# Patient Record
Sex: Male | Born: 1955 | Race: White | Hispanic: No | Smoking: Former smoker
Health system: Southern US, Community
[De-identification: ages and names within clinical notes are randomized; demographics above are authoritative.]

## PROBLEM LIST (undated history)

## (undated) DIAGNOSIS — W3400XA Accidental discharge from unspecified firearms or gun, initial encounter: Secondary | ICD-10-CM

## (undated) HISTORY — PX: ABDOMINAL SURGERY: SHX537

## (undated) HISTORY — PX: OTHER SURGICAL HISTORY: SHX169

---

## 1999-08-18 ENCOUNTER — Emergency Department (HOSPITAL_COMMUNITY): Admission: EM | Admit: 1999-08-18 | Discharge: 1999-08-18 | Payer: Self-pay | Admitting: Emergency Medicine

## 1999-08-21 ENCOUNTER — Emergency Department (HOSPITAL_COMMUNITY): Admission: EM | Admit: 1999-08-21 | Discharge: 1999-08-21 | Payer: Self-pay | Admitting: Emergency Medicine

## 2000-12-30 ENCOUNTER — Encounter: Payer: Self-pay | Admitting: Emergency Medicine

## 2000-12-30 ENCOUNTER — Emergency Department (HOSPITAL_COMMUNITY): Admission: EM | Admit: 2000-12-30 | Discharge: 2000-12-30 | Payer: Self-pay | Admitting: *Deleted

## 2001-01-04 ENCOUNTER — Emergency Department (HOSPITAL_COMMUNITY): Admission: EM | Admit: 2001-01-04 | Discharge: 2001-01-04 | Payer: Self-pay | Admitting: Emergency Medicine

## 2001-03-15 ENCOUNTER — Encounter: Payer: Self-pay | Admitting: Emergency Medicine

## 2001-03-16 ENCOUNTER — Encounter: Payer: Self-pay | Admitting: Orthopedic Surgery

## 2001-03-16 ENCOUNTER — Inpatient Hospital Stay (HOSPITAL_COMMUNITY): Admission: AC | Admit: 2001-03-16 | Discharge: 2001-03-18 | Payer: Self-pay

## 2003-07-10 ENCOUNTER — Inpatient Hospital Stay (HOSPITAL_COMMUNITY): Admission: EM | Admit: 2003-07-10 | Discharge: 2003-07-11 | Payer: Self-pay | Admitting: *Deleted

## 2004-10-11 ENCOUNTER — Emergency Department (HOSPITAL_COMMUNITY): Admission: EM | Admit: 2004-10-11 | Discharge: 2004-10-11 | Payer: Self-pay | Admitting: Emergency Medicine

## 2004-11-17 ENCOUNTER — Inpatient Hospital Stay (HOSPITAL_COMMUNITY): Admission: EM | Admit: 2004-11-17 | Discharge: 2004-11-21 | Payer: Self-pay | Admitting: Emergency Medicine

## 2008-12-22 ENCOUNTER — Ambulatory Visit: Payer: Self-pay | Admitting: Internal Medicine

## 2009-04-15 ENCOUNTER — Inpatient Hospital Stay (HOSPITAL_COMMUNITY): Admission: EM | Admit: 2009-04-15 | Discharge: 2009-04-17 | Payer: Self-pay | Admitting: Emergency Medicine

## 2010-09-20 LAB — URINE MICROSCOPIC-ADD ON

## 2010-09-20 LAB — DIFFERENTIAL
Eosinophils Relative: 1 % (ref 0–5)
Lymphocytes Relative: 17 % (ref 12–46)
Lymphs Abs: 1.5 10*3/uL (ref 0.7–4.0)
Monocytes Absolute: 0.4 10*3/uL (ref 0.1–1.0)
Monocytes Relative: 4 % (ref 3–12)
Neutro Abs: 6.9 10*3/uL (ref 1.7–7.7)

## 2010-09-20 LAB — CBC
HCT: 36.8 % — ABNORMAL LOW (ref 39.0–52.0)
HCT: 40.8 % (ref 39.0–52.0)
HCT: 43.7 % (ref 39.0–52.0)
Hemoglobin: 14.1 g/dL (ref 13.0–17.0)
MCHC: 34.1 g/dL (ref 30.0–36.0)
MCV: 89.7 fL (ref 78.0–100.0)
MCV: 90.2 fL (ref 78.0–100.0)
MCV: 91 fL (ref 78.0–100.0)
Platelets: 108 10*3/uL — ABNORMAL LOW (ref 150–400)
Platelets: 129 10*3/uL — ABNORMAL LOW (ref 150–400)
RBC: 4.05 MIL/uL — ABNORMAL LOW (ref 4.22–5.81)
RDW: 12.5 % (ref 11.5–15.5)
WBC: 10 10*3/uL (ref 4.0–10.5)
WBC: 6 10*3/uL (ref 4.0–10.5)

## 2010-09-20 LAB — COMPREHENSIVE METABOLIC PANEL
ALT: 20 U/L (ref 0–53)
Albumin: 4.1 g/dL (ref 3.5–5.2)
Alkaline Phosphatase: 59 U/L (ref 39–117)
BUN: 11 mg/dL (ref 6–23)
Calcium: 8.9 mg/dL (ref 8.4–10.5)
Chloride: 103 mEq/L (ref 96–112)
Creatinine, Ser: 1.07 mg/dL (ref 0.4–1.5)
GFR calc Af Amer: >60 mL/min (ref >60)
Total Bilirubin: 0.6 mg/dL (ref 0.3–1.2)
Total Protein: 6.5 g/dL (ref 6.0–8.3)

## 2010-09-20 LAB — POCT I-STAT, CHEM 8
BUN: 14 mg/dL (ref 6–23)
Calcium, Ion: 1.15 mmol/L (ref 1.12–1.32)
Creatinine, Ser: 1.3 mg/dL (ref 0.4–1.5)
Glucose, Bld: 118 mg/dL — ABNORMAL HIGH (ref 70–99)
HCT: 45 % (ref 39.0–52.0)
Hemoglobin: 15.3 g/dL (ref 13.0–17.0)
TCO2: 33 mmol/L (ref 0–100)

## 2010-09-20 LAB — URINALYSIS, ROUTINE W REFLEX MICROSCOPIC
Hgb urine dipstick: NEGATIVE
Leukocytes, UA: NEGATIVE
Nitrite: NEGATIVE
Protein, ur: 30 mg/dL — AB
Specific Gravity, Urine: 1.019 (ref 1.005–1.030)
Urobilinogen, UA: 0.2 mg/dL (ref 0.0–1.0)

## 2010-09-20 LAB — BASIC METABOLIC PANEL
CO2: 30 mEq/L (ref 19–32)
Calcium: 8.8 mg/dL (ref 8.4–10.5)
Chloride: 109 mEq/L (ref 96–112)
Creatinine, Ser: 1.15 mg/dL (ref 0.4–1.5)
GFR calc Af Amer: >60 mL/min (ref >60)
GFR calc non Af Amer: >60 mL/min (ref >60)
Potassium: 4.1 mEq/L (ref 3.5–5.1)
Potassium: 4.3 mEq/L (ref 3.5–5.1)
Sodium: 139 mEq/L (ref 135–145)
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
## 2010-11-02 NOTE — Discharge Summary (Signed)
NAMEMAMOUDOU, MULVEHILL                ACCOUNT NO.:  [id number]   MEDICAL RECORD NO.:  [id number]          PATIENT TYPE:  INP   LOCATION:  5019                         FACILITY:  [facility]   PHYSICIAN:  Leonie Man, M.D.   DATE OF BIRTH:  1955/08/22   DATE OF ADMISSION:  11/16/2004  DATE OF DISCHARGE:  11/21/2004                                 DISCHARGE SUMMARY   DISCHARGE DIAGNOSES:  1.  Small bowel obstruction, resolved.  2.  History of cholecystectomy, exploratory.   Mr. Vanbenschoten is a  55 year old male patient who developed abdominal pain  several days prior to admission. He states it comes in waves. A CT scan in  the emergency room on November 17, 2004, showed a partial small bowel obstruction  with a transition zone in the right upper quadrant. He does have some  vomiting, but no flatus or bowel movement. He was in the hospital over the  next several days and gradually his bowel function returned to normal,  initial less than 6, his small bowel obstruction had resolved.   He will be discharged to home in stable condition. He has no specific follow-  up. He is to call if he has further problems.      Guy Franco, P.A.      Leonie Man, M.D.  Electronically Signed    LB/MEDQ  D:  02/25/2005  T:  02/25/2005  Job:  [id number]

## 2010-11-02 NOTE — Discharge Summary (Signed)
[name], BRIDGE                ACCOUNT NO.:  [id number]   MEDICAL RECORD NO.:  [id number]          PATIENT TYPE:  INP   LOCATION:  5019                         FACILITY:  [facility]   PHYSICIAN:  Leonie Man, M.D.   DATE OF BIRTH:  1956/01/23   DATE OF ADMISSION:  11/16/2004  DATE OF DISCHARGE:  11/21/2004                                 DISCHARGE SUMMARY   ADMISSION DIAGNOSIS:  Small bowel obstruction.   DISCHARGE DIAGNOSIS:  Small bowel obstruction.   PROCEDURES IN HOSPITAL:  None.   DISCHARGE DIET:  Unrestricted.   MEDICATIONS:  None.   NOTE:  Brian Baldwin is a 55 year old man admitted after 1-1/2 days of after  having developed abdominal pain.  He has had no passage of flatus.  The pain  waxes and wanes.  CT scan today showed partial small bowel obstruction with  transition zone in the right upper quadrant.  The patient was admitted and  placed on nasogastric decompression and observed.  Over the ensuing days,  the patient began to feel better with continued improvement in his KUB's and  bowel obstructive status.  He was started on clear liquids, which he  tolerated without difficulty.  He is being discharged now to follow up  p.r.n. should this recur.  There were no medications given at the time of  discharge.      Leonie Man, M.D.  Electronically Signed     PB/MEDQ  D:  02/19/2005  T:  02/19/2005  Job:  [id number]
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
## 2013-03-06 ENCOUNTER — Emergency Department (HOSPITAL_COMMUNITY)
Admission: EM | Admit: 2013-03-06 | Discharge: 2013-03-07 | Disposition: A | Discharge status: Home / Self Care | Payer: Medicaid Other | Attending: Emergency Medicine | Admitting: Emergency Medicine

## 2013-03-06 DIAGNOSIS — R141 Gas pain: Secondary | ICD-10-CM | POA: Insufficient documentation

## 2013-03-06 DIAGNOSIS — Z79899 Other long term (current) drug therapy: Secondary | ICD-10-CM | POA: Insufficient documentation

## 2013-03-06 DIAGNOSIS — R110 Nausea: Secondary | ICD-10-CM | POA: Insufficient documentation

## 2013-03-06 DIAGNOSIS — R142 Eructation: Secondary | ICD-10-CM | POA: Insufficient documentation

## 2013-03-06 DIAGNOSIS — Z87828 Personal history of other (healed) physical injury and trauma: Secondary | ICD-10-CM | POA: Insufficient documentation

## 2013-03-06 DIAGNOSIS — Z87891 Personal history of nicotine dependence: Secondary | ICD-10-CM | POA: Insufficient documentation

## 2013-03-06 DIAGNOSIS — R109 Unspecified abdominal pain: Secondary | ICD-10-CM

## 2013-03-06 DIAGNOSIS — R1084 Generalized abdominal pain: Secondary | ICD-10-CM | POA: Insufficient documentation

## 2013-03-06 HISTORY — DX: Accidental discharge from unspecified firearms or gun, initial encounter: W34.00XA

## 2013-03-06 MED ORDER — SODIUM CHLORIDE 0.9 % IV SOLN
INTRAVENOUS | Status: DC
  Administered 2013-03-07: 02:00:00 via INTRAVENOUS

## 2013-03-06 NOTE — ED Notes (Signed)
Bed: WA19 Expected date:  Expected time:  Means of arrival:  Comments: EMS-abdominal pain 

## 2013-03-07 ENCOUNTER — Emergency Department (HOSPITAL_COMMUNITY): Payer: Medicaid Other

## 2013-03-07 ENCOUNTER — Encounter (HOSPITAL_COMMUNITY): Payer: Self-pay | Admitting: Emergency Medicine

## 2013-03-07 LAB — COMPREHENSIVE METABOLIC PANEL
ALT: 20 U/L (ref 0–53)
Alkaline Phosphatase: 64 U/L (ref 39–117)
CO2: 26 mEq/L (ref 19–32)
Calcium: 9.7 mg/dL (ref 8.4–10.5)
Chloride: 98 mEq/L (ref 96–112)
GFR calc Af Amer: 83 mL/min — ABNORMAL LOW (ref >90)
GFR calc non Af Amer: 71 mL/min — ABNORMAL LOW (ref >90)
Glucose, Bld: 125 mg/dL — ABNORMAL HIGH (ref 70–99)
Sodium: 135 mEq/L (ref 135–145)
Total Bilirubin: 0.4 mg/dL (ref 0.3–1.2)

## 2013-03-07 LAB — URINALYSIS, ROUTINE W REFLEX MICROSCOPIC
Bilirubin Urine: NEGATIVE
Glucose, UA: NEGATIVE mg/dL
Hgb urine dipstick: NEGATIVE
Ketones, ur: NEGATIVE mg/dL
Protein, ur: NEGATIVE mg/dL
Urobilinogen, UA: 1 mg/dL (ref 0.0–1.0)
pH: 7.5 (ref 5.0–8.0)

## 2013-03-07 LAB — CBC
HCT: 44.2 % (ref 39.0–52.0)
Hemoglobin: 15.3 g/dL (ref 13.0–17.0)
MCH: 30.3 pg (ref 26.0–34.0)
Platelets: 134 10*3/uL — ABNORMAL LOW (ref 150–400)
RBC: 5.05 MIL/uL (ref 4.22–5.81)

## 2013-03-07 MED ORDER — IOHEXOL 300 MG/ML  SOLN
100.0000 mL | Freq: Once | INTRAMUSCULAR | Status: AC | PRN
  Administered 2013-03-07: 100 mL via INTRAVENOUS

## 2013-03-07 MED ORDER — FENTANYL CITRATE 0.05 MG/ML IJ SOLN
50.0000 ug | Freq: Once | INTRAMUSCULAR | Status: AC
  Administered 2013-03-07: 50 ug via INTRAVENOUS
  Filled 2013-03-07: qty 2

## 2013-03-07 MED ORDER — HYDROMORPHONE HCL PF 1 MG/ML IJ SOLN
1.0000 mg | INTRAMUSCULAR | Status: DC | PRN
  Administered 2013-03-07 (×2): 1 mg via INTRAVENOUS
  Filled 2013-03-07 (×2): qty 1

## 2013-03-07 MED ORDER — HYDROCODONE-ACETAMINOPHEN 5-325 MG PO TABS: 1.0000 | ORAL_TABLET | Freq: Four times a day (QID) | ORAL | Status: DC | PRN

## 2013-03-07 MED ORDER — PROMETHAZINE HCL 25 MG PO TABS: 25.0000 mg | ORAL_TABLET | Freq: Four times a day (QID) | ORAL | Status: DC | PRN

## 2013-03-07 MED ORDER — ONDANSETRON HCL 4 MG/2ML IJ SOLN
4.0000 mg | Freq: Once | INTRAMUSCULAR | Status: AC
  Administered 2013-03-07: 4 mg via INTRAVENOUS
  Filled 2013-03-07: qty 2

## 2013-03-07 MED ORDER — IOHEXOL 300 MG/ML  SOLN
50.0000 mL | Freq: Once | INTRAMUSCULAR | Status: AC | PRN
  Administered 2013-03-07: 50 mL via ORAL

## 2013-03-07 NOTE — ED Provider Notes (Signed)
Medical screening examination/treatment/procedure(s) were performed by non-physician practitioner and as supervising physician I was immediately available for consultation/collaboration.  Sunnie Nielsen, MD 03/07/13 [phone]
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
## 2013-03-07 NOTE — ED Notes (Signed)
Pt returned from xray

## 2013-03-07 NOTE — ED Notes (Signed)
Patient transported to X-ray 

## 2013-03-07 NOTE — ED Notes (Signed)
Patient given urinal for urine speciman.

## 2018-06-16 DIAGNOSIS — J069 Acute upper respiratory infection, unspecified: Secondary | ICD-10-CM | POA: Insufficient documentation

## 2018-06-16 DIAGNOSIS — B9789 Other viral agents as the cause of diseases classified elsewhere: Secondary | ICD-10-CM | POA: Insufficient documentation

## 2018-06-16 DIAGNOSIS — Z87891 Personal history of nicotine dependence: Secondary | ICD-10-CM | POA: Insufficient documentation

## 2018-06-16 DIAGNOSIS — J358 Other chronic diseases of tonsils and adenoids: Secondary | ICD-10-CM | POA: Insufficient documentation

## 2018-06-16 DIAGNOSIS — Z79899 Other long term (current) drug therapy: Secondary | ICD-10-CM | POA: Insufficient documentation

## 2018-06-17 ENCOUNTER — Emergency Department (HOSPITAL_COMMUNITY)
Admission: EM | Admit: 2018-06-17 | Discharge: 2018-06-17 | Disposition: A | Discharge status: Home / Self Care | Payer: Self-pay | Attending: Emergency Medicine | Admitting: Emergency Medicine

## 2018-06-17 DIAGNOSIS — B9789 Other viral agents as the cause of diseases classified elsewhere: Secondary | ICD-10-CM

## 2018-06-17 DIAGNOSIS — J358 Other chronic diseases of tonsils and adenoids: Secondary | ICD-10-CM

## 2018-06-17 DIAGNOSIS — J069 Acute upper respiratory infection, unspecified: Secondary | ICD-10-CM

## 2018-06-17 LAB — GROUP A STREP BY PCR: Group A Strep by PCR: NOT DETECTED

## 2018-06-17 NOTE — ED Notes (Signed)
Patient is A&Ox4.  No signs of distress noted.  Please see providers complete history and physical exam.  

## 2018-06-17 NOTE — Discharge Instructions (Addendum)
You may alternate Tylenol 1000 mg every 6 hours as needed for pain and Ibuprofen 800 mg every 8 hours as needed for pain.  Please take Ibuprofen with food.  You may gargle with warm salt water several times a day as needed for sore throat.  You may use over-the-counter Chloraseptic spray, throat lozenges as needed.  Your strep test today was negative.

## 2018-06-17 NOTE — ED Triage Notes (Signed)
Patient states he has had a productive cough, x1 week, with complaints of soreness. Pt states that he looked in the mirror tonight and norticed a white spot on the back of his throat.
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
## 2019-01-11 ENCOUNTER — Encounter (HOSPITAL_COMMUNITY): Payer: Self-pay

## 2019-01-11 ENCOUNTER — Other Ambulatory Visit: Payer: Self-pay

## 2019-01-11 ENCOUNTER — Emergency Department (HOSPITAL_COMMUNITY)
Admission: EM | Admit: 2019-01-11 | Discharge: 2019-01-11 | Disposition: A | Discharge status: Home / Self Care | Payer: Self-pay | Attending: Emergency Medicine | Admitting: Emergency Medicine

## 2019-01-11 ENCOUNTER — Emergency Department (HOSPITAL_COMMUNITY): Payer: Self-pay

## 2019-01-11 DIAGNOSIS — R51 Headache: Secondary | ICD-10-CM | POA: Insufficient documentation

## 2019-01-11 DIAGNOSIS — Z79899 Other long term (current) drug therapy: Secondary | ICD-10-CM | POA: Insufficient documentation

## 2019-01-11 DIAGNOSIS — Z20828 Contact with and (suspected) exposure to other viral communicable diseases: Secondary | ICD-10-CM | POA: Insufficient documentation

## 2019-01-11 DIAGNOSIS — Z87891 Personal history of nicotine dependence: Secondary | ICD-10-CM | POA: Insufficient documentation

## 2019-01-11 DIAGNOSIS — R519 Headache, unspecified: Secondary | ICD-10-CM

## 2019-01-11 DIAGNOSIS — R0602 Shortness of breath: Secondary | ICD-10-CM | POA: Insufficient documentation

## 2019-01-11 LAB — BASIC METABOLIC PANEL
Anion gap: 10 (ref 5–15)
BUN: 13 mg/dL (ref 8–23)
CO2: 25 mmol/L (ref 22–32)
Calcium: 9.3 mg/dL (ref 8.9–10.3)
Chloride: 104 mmol/L (ref 98–111)
Creatinine, Ser: 1.12 mg/dL (ref 0.61–1.24)
GFR calc Af Amer: >60 mL/min (ref >60)
GFR calc non Af Amer: >60 mL/min (ref >60)
Glucose, Bld: 95 mg/dL (ref 70–99)
Potassium: 4.1 mmol/L (ref 3.5–5.1)
Sodium: 139 mmol/L (ref 135–145)

## 2019-01-11 LAB — CBC
HCT: 46.8 % (ref 39.0–52.0)
Hemoglobin: 15.4 g/dL (ref 13.0–17.0)
MCH: 30.2 pg (ref 26.0–34.0)
MCHC: 32.9 g/dL (ref 30.0–36.0)
MCV: 91.8 fL (ref 80.0–100.0)
Platelets: 146 10*3/uL — ABNORMAL LOW (ref 150–400)
RBC: 5.1 MIL/uL (ref 4.22–5.81)
RDW: 12.3 % (ref 11.5–15.5)
WBC: 6.6 10*3/uL (ref 4.0–10.5)
nRBC: 0 % (ref 0.0–0.2)

## 2019-01-11 LAB — TROPONIN I (HIGH SENSITIVITY): Troponin I (High Sensitivity): 3 ng/L (ref <18)

## 2019-01-11 MED ORDER — SODIUM CHLORIDE 0.9% FLUSH
3.0000 mL | Freq: Once | INTRAVENOUS | Status: AC
  Administered 2019-01-11: 3 mL via INTRAVENOUS
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
## 2019-01-11 NOTE — ED Triage Notes (Signed)
Patient c/o a headache x 1 week. Patient also c/o chest tightness since yesterday. patient states he is constantly SOB.

## 2019-01-11 NOTE — Discharge Instructions (Signed)
Follow-up with your doctor for further management of the symptoms.

## 2019-01-12 LAB — NOVEL CORONAVIRUS, NAA (HOSP ORDER, SEND-OUT TO REF LAB; TAT 18-24 HRS): SARS-CoV-2, NAA: NOT DETECTED

## 2020-01-06 ENCOUNTER — Other Ambulatory Visit: Payer: Self-pay

## 2020-01-06 ENCOUNTER — Telehealth (INDEPENDENT_AMBULATORY_CARE_PROVIDER_SITE_OTHER): Payer: No Payment, Other | Admitting: Psychiatric/Mental Health

## 2020-01-06 DIAGNOSIS — F3341 Major depressive disorder, recurrent, in partial remission: Secondary | ICD-10-CM | POA: Diagnosis not present

## 2020-01-06 DIAGNOSIS — F319 Bipolar disorder, unspecified: Secondary | ICD-10-CM | POA: Insufficient documentation

## 2020-01-06 DIAGNOSIS — F329 Major depressive disorder, single episode, unspecified: Secondary | ICD-10-CM | POA: Insufficient documentation

## 2020-01-06 MED ORDER — OXCARBAZEPINE 150 MG PO TABS: 300.0000 mg | ORAL_TABLET | Freq: Every day | ORAL | 3 refills | Status: DC

## 2020-01-06 MED ORDER — SERTRALINE HCL 50 MG PO TABS: 50.0000 mg | ORAL_TABLET | Freq: Every day | ORAL | 3 refills | Status: DC
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
## 2020-02-05 ENCOUNTER — Encounter (HOSPITAL_COMMUNITY): Payer: Self-pay | Admitting: Psychiatric/Mental Health

## 2021-02-24 IMAGING — CT CT HEAD WITHOUT CONTRAST
3 series · 15 of 47 positions shown, 18 images · non-contrast
Comparison: None.

CLINICAL DATA: Headache for 1 week

EXAM:
CT HEAD WITHOUT CONTRAST
TECHNIQUE: Contiguous axial images were obtained from the base of the skull
through the vertex without intravenous contrast.

[Series 2: head wo · axial · 0.47mm/px · z∈[+1646,+1776]mm · 9 of 32 slices shown, 12 images]
[im 3/32  brain]
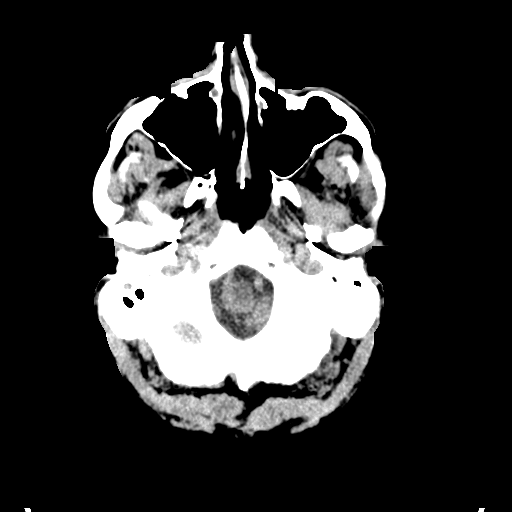
[im 3/32  bone]
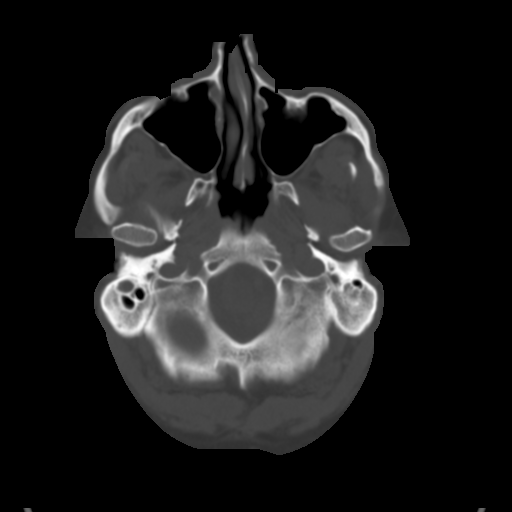
[im 6/32  brain]
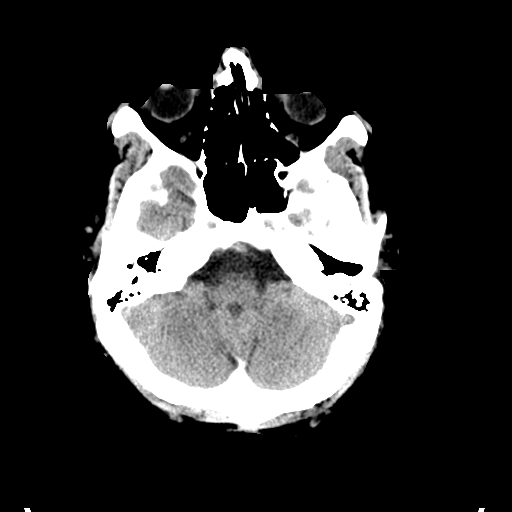
[im 9/32  brain]
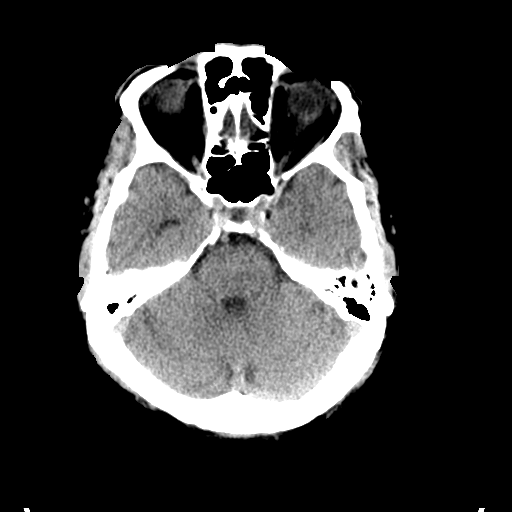
[im 12/32  brain]
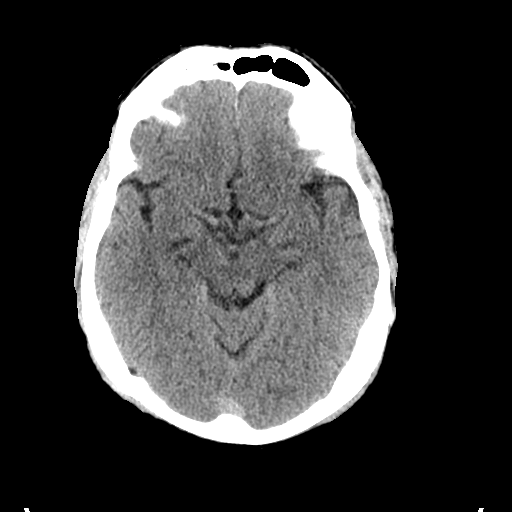
[im 17/32  brain]
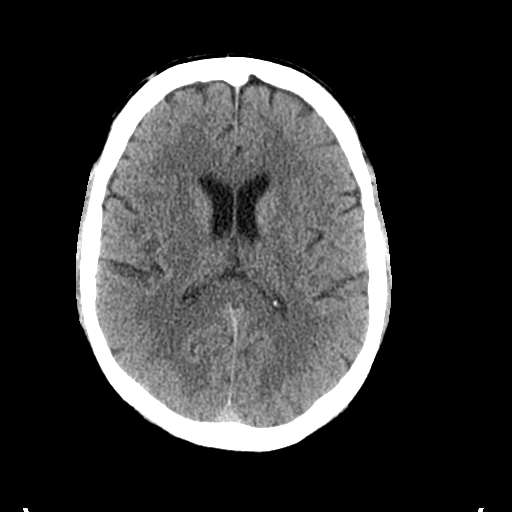
[im 17/32  bone]
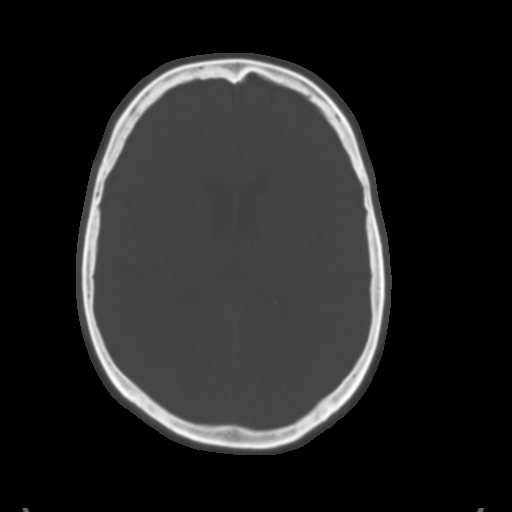
[im 20/32  brain]
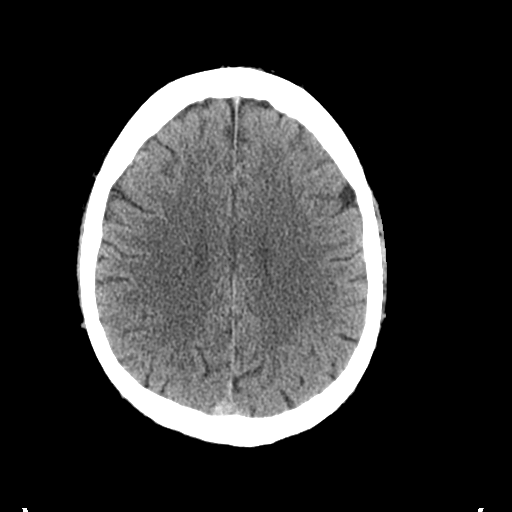
[im 23/32  brain]
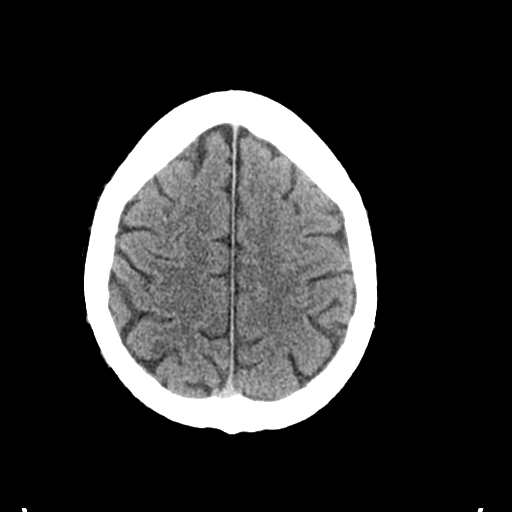
[im 26/32  brain]
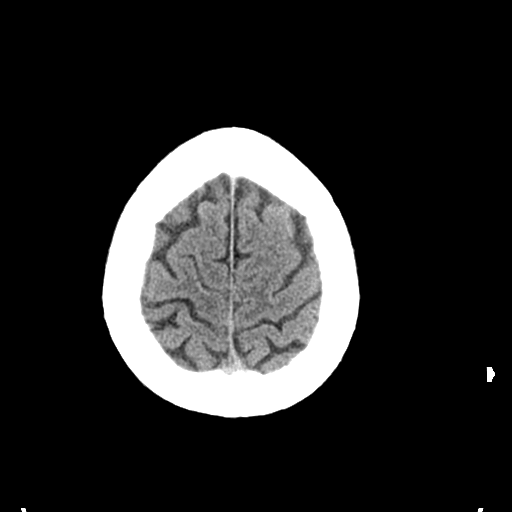
[im 29/32  brain]
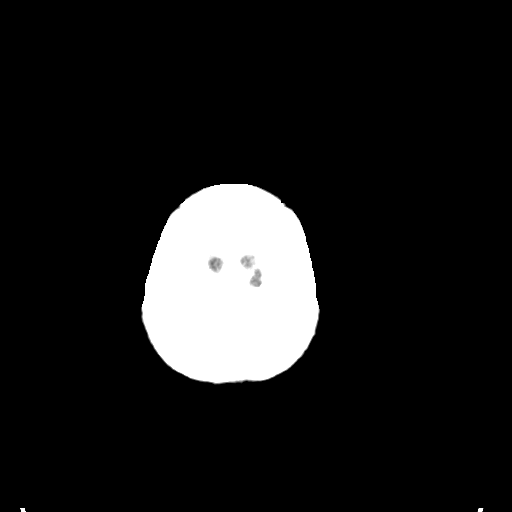
[im 29/32  bone]
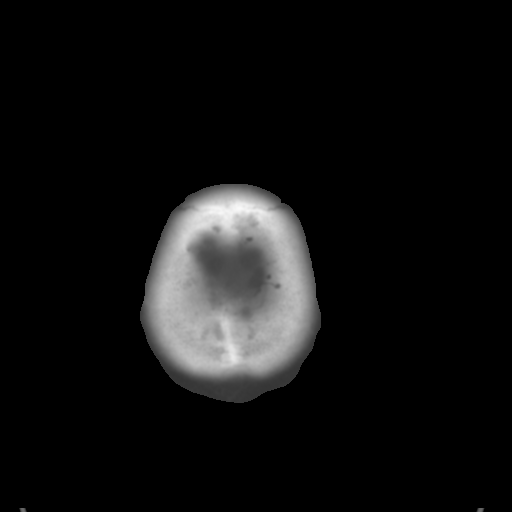

[Series 4: coronal soft tissue · coronal · 0.39mm/px · 3 of 67 slices shown]
[im 23/67  brain]
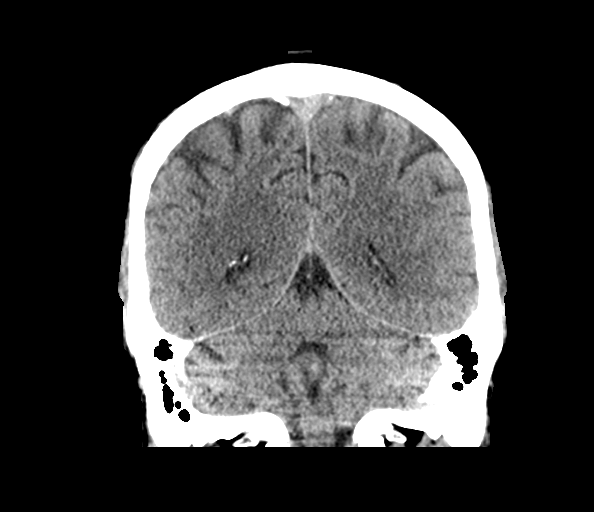
[im 30/67  brain]
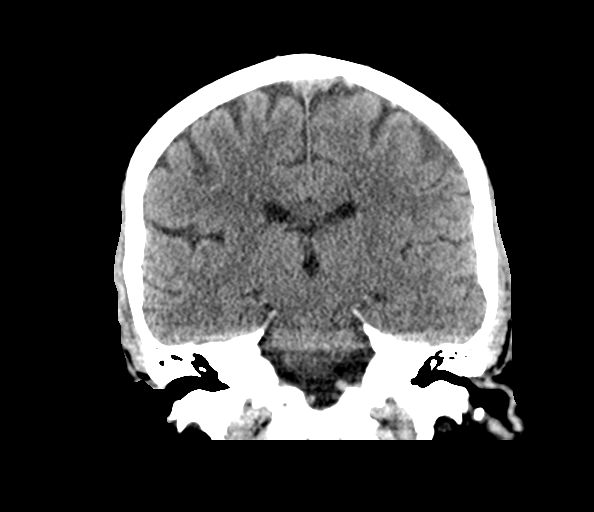
[im 37/67  brain]
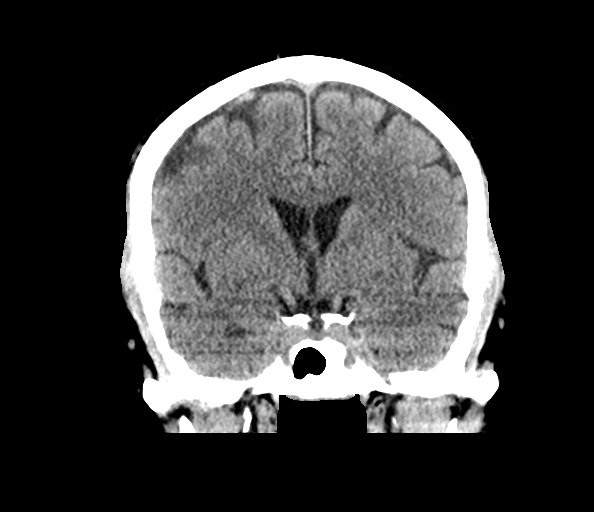

[Series 5: sagittal soft tissue · sagittal · 0.43mm/px · 3 of 53 slices shown]
[im 18/53  brain]
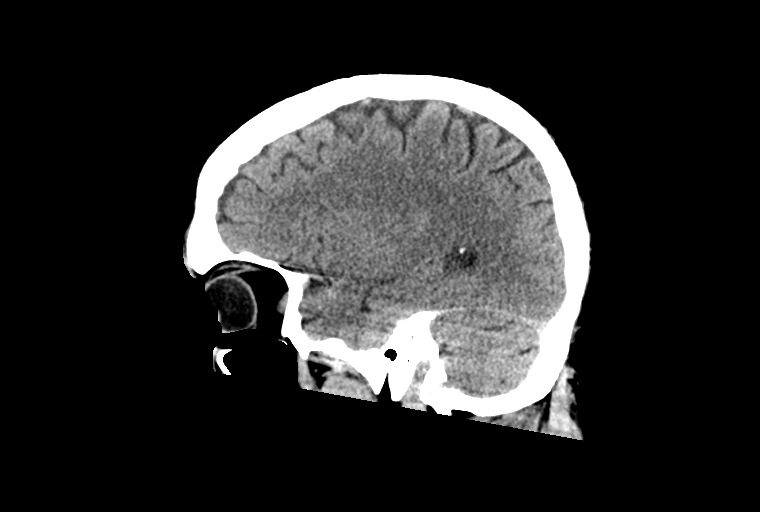
[im 27/53  brain]
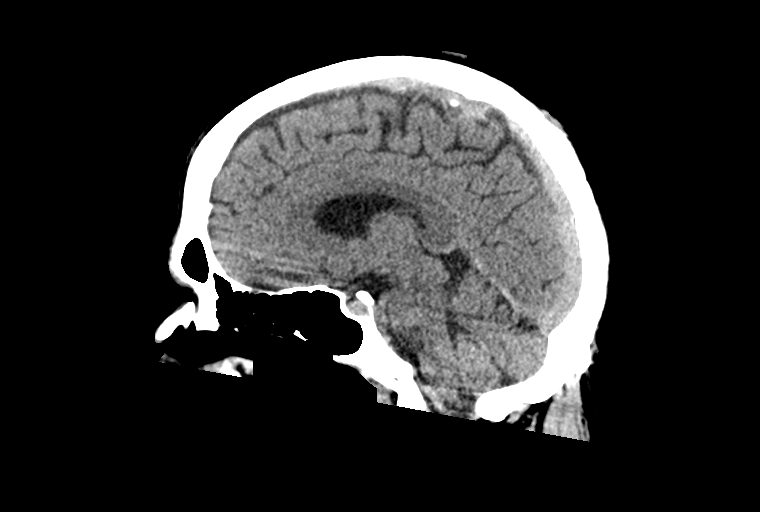
[im 35/53  brain]
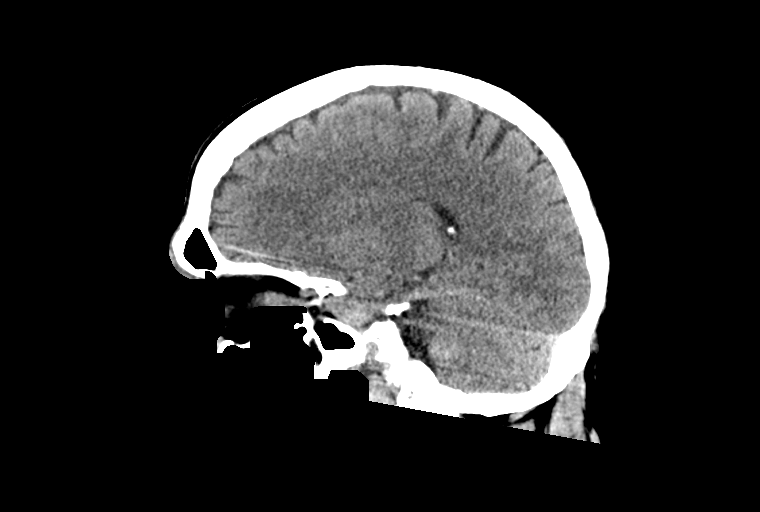

[15 of 47 positions shown; findings below may reference images not displayed]

FINDINGS: Brain: No evidence of acute infarction, hemorrhage, hydrocephalus,
extra-axial collection or mass lesion/mass effect.

Vascular: No hyperdense vessel or unexpected calcification.

Skull: Normal. Negative for fracture or focal lesion.

Sinuses/Orbits: No acute finding.

Other: None.
IMPRESSION: No acute intracranial pathology. No non-contrast CT findings to
explain headache.

## 2022-02-11 ENCOUNTER — Ambulatory Visit (INDEPENDENT_AMBULATORY_CARE_PROVIDER_SITE_OTHER): Payer: Self-pay | Admitting: Psychiatry

## 2022-02-11 VITALS — BP 167/89 | HR 81

## 2022-02-11 DIAGNOSIS — F3162 Bipolar disorder, current episode mixed, moderate: Secondary | ICD-10-CM

## 2022-02-11 DIAGNOSIS — F3341 Major depressive disorder, recurrent, in partial remission: Secondary | ICD-10-CM | POA: Diagnosis not present

## 2022-02-11 MED ORDER — ARIPIPRAZOLE 5 MG PO TABS: 5.0000 mg | ORAL_TABLET | Freq: Every day | ORAL | 1 refills | Status: DC

## 2022-02-11 MED ORDER — SERTRALINE HCL 50 MG PO TABS: 50.0000 mg | ORAL_TABLET | Freq: Every day | ORAL | 1 refills | Status: DC

## 2022-02-11 NOTE — Patient Instructions (Signed)
Follow-up in 4 weeks
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
## 2022-02-14 ENCOUNTER — Encounter (HOSPITAL_COMMUNITY): Payer: Self-pay | Admitting: Psychiatry

## 2022-03-11 ENCOUNTER — Ambulatory Visit (INDEPENDENT_AMBULATORY_CARE_PROVIDER_SITE_OTHER): Payer: Self-pay | Admitting: Psychiatry

## 2022-03-11 DIAGNOSIS — F3162 Bipolar disorder, current episode mixed, moderate: Secondary | ICD-10-CM

## 2022-03-11 MED ORDER — SERTRALINE HCL 50 MG PO TABS: 50.0000 mg | ORAL_TABLET | Freq: Every day | ORAL | 2 refills | Status: DC

## 2022-03-11 MED ORDER — ARIPIPRAZOLE 5 MG PO TABS: 5.0000 mg | ORAL_TABLET | Freq: Every day | ORAL | 2 refills | Status: DC
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
## 2022-03-11 NOTE — Patient Instructions (Signed)
Follow up in 8 weeks 

## 2022-03-19 ENCOUNTER — Encounter (HOSPITAL_COMMUNITY): Payer: Self-pay | Admitting: Psychiatry

## 2022-05-06 ENCOUNTER — Ambulatory Visit (INDEPENDENT_AMBULATORY_CARE_PROVIDER_SITE_OTHER): Payer: Self-pay | Admitting: Psychiatry

## 2022-05-06 VITALS — BP 166/91 | HR 76

## 2022-05-06 DIAGNOSIS — F3162 Bipolar disorder, current episode mixed, moderate: Secondary | ICD-10-CM | POA: Diagnosis not present

## 2022-05-06 DIAGNOSIS — F319 Bipolar disorder, unspecified: Secondary | ICD-10-CM

## 2022-05-06 MED ORDER — ARIPIPRAZOLE 5 MG PO TABS: 5.0000 mg | ORAL_TABLET | Freq: Every day | ORAL | 2 refills | Status: DC

## 2022-05-06 MED ORDER — SERTRALINE HCL 50 MG PO TABS: 50.0000 mg | ORAL_TABLET | Freq: Every day | ORAL | 2 refills | Status: DC

## 2022-05-06 NOTE — Patient Instructions (Addendum)
Follow in 3 months ___________________________
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
## 2022-05-07 ENCOUNTER — Emergency Department (HOSPITAL_COMMUNITY)
Admission: EM | Admit: 2022-05-07 | Discharge: 2022-05-07 | Disposition: A | Discharge status: Home / Self Care | Payer: Self-pay | Attending: Emergency Medicine | Admitting: Emergency Medicine

## 2022-05-07 ENCOUNTER — Other Ambulatory Visit: Payer: Self-pay

## 2022-05-07 ENCOUNTER — Telehealth (HOSPITAL_COMMUNITY): Payer: Self-pay | Admitting: Psychiatry

## 2022-05-07 ENCOUNTER — Encounter (HOSPITAL_COMMUNITY): Payer: Self-pay | Admitting: Emergency Medicine

## 2022-05-07 ENCOUNTER — Emergency Department (HOSPITAL_COMMUNITY): Payer: Self-pay

## 2022-05-07 ENCOUNTER — Other Ambulatory Visit (HOSPITAL_COMMUNITY): Payer: Self-pay

## 2022-05-07 DIAGNOSIS — I10 Essential (primary) hypertension: Secondary | ICD-10-CM | POA: Insufficient documentation

## 2022-05-07 DIAGNOSIS — R519 Headache, unspecified: Secondary | ICD-10-CM | POA: Insufficient documentation

## 2022-05-07 DIAGNOSIS — Z79899 Other long term (current) drug therapy: Secondary | ICD-10-CM | POA: Insufficient documentation

## 2022-05-07 LAB — BASIC METABOLIC PANEL
Anion gap: 11 (ref 5–15)
BUN: 13 mg/dL (ref 8–23)
CO2: 23 mmol/L (ref 22–32)
Calcium: 9.3 mg/dL (ref 8.9–10.3)
Chloride: 105 mmol/L (ref 98–111)
Creatinine, Ser: 1.13 mg/dL (ref 0.61–1.24)
GFR, Estimated: >60 mL/min (ref >60)
Glucose, Bld: 104 mg/dL — ABNORMAL HIGH (ref 70–99)
Potassium: 4 mmol/L (ref 3.5–5.1)
Sodium: 139 mmol/L (ref 135–145)

## 2022-05-07 LAB — CBC WITH DIFFERENTIAL/PLATELET
Abs Immature Granulocytes: 0.02 10*3/uL (ref 0.00–0.07)
Basophils Absolute: 0 10*3/uL (ref 0.0–0.1)
Basophils Relative: 1 %
Eosinophils Absolute: 0.1 10*3/uL (ref 0.0–0.5)
Eosinophils Relative: 2 %
HCT: 44.4 % (ref 39.0–52.0)
Hemoglobin: 15.3 g/dL (ref 13.0–17.0)
Immature Granulocytes: 0 %
Lymphocytes Relative: 39 %
Lymphs Abs: 2.2 10*3/uL (ref 0.7–4.0)
MCH: 30.7 pg (ref 26.0–34.0)
MCHC: 34.5 g/dL (ref 30.0–36.0)
MCV: 89.2 fL (ref 80.0–100.0)
Monocytes Absolute: 0.4 10*3/uL (ref 0.1–1.0)
Monocytes Relative: 7 %
Neutro Abs: 2.9 10*3/uL (ref 1.7–7.7)
Neutrophils Relative %: 51 %
Platelets: 141 10*3/uL — ABNORMAL LOW (ref 150–400)
RBC: 4.98 MIL/uL (ref 4.22–5.81)
RDW: 12.2 % (ref 11.5–15.5)
WBC: 5.6 10*3/uL (ref 4.0–10.5)
nRBC: 0 % (ref 0.0–0.2)

## 2022-05-07 LAB — TROPONIN I (HIGH SENSITIVITY)
Troponin I (High Sensitivity): 6 ng/L (ref <18)
Troponin I (High Sensitivity): 5 ng/L (ref <18)

## 2022-05-07 MED ORDER — AMLODIPINE BESYLATE 5 MG PO TABS
5.0000 mg | ORAL_TABLET | Freq: Once | ORAL | Status: AC
  Administered 2022-05-07: 5 mg via ORAL
  Filled 2022-05-07: qty 1

## 2022-05-07 MED ORDER — KETOROLAC TROMETHAMINE 15 MG/ML IJ SOLN
15.0000 mg | Freq: Once | INTRAMUSCULAR | Status: AC
  Administered 2022-05-07: 15 mg via INTRAVENOUS
  Filled 2022-05-07: qty 1

## 2022-05-07 MED ORDER — DIPHENHYDRAMINE HCL 50 MG/ML IJ SOLN: 12.5000 mg | Freq: Once | INTRAMUSCULAR | Status: DC

## 2022-05-07 MED ORDER — AMLODIPINE BESYLATE 5 MG PO TABS
5.0000 mg | ORAL_TABLET | Freq: Every day | ORAL | 0 refills | Status: DC
  Filled 2022-05-07: qty 30, 30d supply, fill #0

## 2022-05-07 MED ORDER — SODIUM CHLORIDE 0.9 % IV BOLUS
1000.0000 mL | Freq: Once | INTRAVENOUS | Status: AC
  Administered 2022-05-07: 1000 mL via INTRAVENOUS

## 2022-05-07 MED ORDER — METOCLOPRAMIDE HCL 5 MG/ML IJ SOLN
10.0000 mg | Freq: Once | INTRAMUSCULAR | Status: DC
  Filled 2022-05-07: qty 2

## 2022-05-07 MED ORDER — SODIUM CHLORIDE 0.9 % IV SOLN: INTRAVENOUS | Status: DC

## 2022-05-07 NOTE — Discharge Planning (Signed)
RNCM consulted regarding medication assistance of pt unable to afford medications.  RNCM utilized [facility] petty cash to cover $4.00 co-pay and filled through [facility].  Rx e-scribed to [facility] and delivered to pt prior to discharge home.  No further RNCM needs identified at this time.

## 2022-05-07 NOTE — ED Notes (Signed)
Pharmacy to bring medications for d/c
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
## 2022-05-07 NOTE — ED Triage Notes (Signed)
Pt reported to ED with c/o central chest pain and headache upon awakening this morning.

## 2022-05-07 NOTE — Telephone Encounter (Signed)
This patient called in wanting to speak with you. He said that he doesn't need his appointment on 05/21/22 anymore because he got labs done today and they are all on MyChart. Should I cancel his lab appointment?  I am unsure if he is still wanting to speak to someone.  Patient # [phone]

## 2022-05-07 NOTE — Discharge Planning (Signed)
RNCM consulted regarding PCP establishment and insurance enrollment. Pt presented to [facility] ED today with chest pian. RNCM met with pt at bedside; pt confirms not having access to follow up care with PCP or insurance coverage. Discussed with patient importance and benefits of establishing PCP, and not utilizing the ED for primary care needs. Pt verbalized understanding and is in agreement. Discussed other options, provided list of local  affordable PCPs. RNCM obtained appointment on (12/5), time (3:15) with Internal Medicine Clinic and placed on After Visit Summary paperwork.  No further case management needs communicated at this time. Vanesa Renier J. Clydene Laming, [place], [place], [place]

## 2022-05-07 NOTE — ED Provider Triage Note (Addendum)
Emergency Medicine Provider Triage Evaluation Note  Brian Baldwin , a 66 y.o. male  was evaluated in triage.  Pt complains of chest pain.  Recent visit for HTN, set up for labs next week.  Woke up this morning with chest pain and headache and became concerned.  No prior cardiac hx.  Review of Systems  Positive: Chest pain, headache Negative: fever  Physical Exam  BP (!) 179/117 (BP Location: Left Arm)   Pulse 71   Temp 98 F (36.7 C)   Resp 17   SpO2 96%  Gen:   Awake, no distress   Resp:  Normal effort  MSK:   Moves extremities without difficulty  Other:  AAOx3, moving extremities well, normal speech  Medical Decision Making  Medically screening exam initiated at 5:47 AM.  Appropriate orders placed.  Brian Baldwin was informed that the remainder of the evaluation will be completed by another provider, this initial triage assessment does not replace that evaluation, and the importance of remaining in the ED until their evaluation is complete.  Chest pain, headache.  Is hypertensive, not currently on meds.  AAOx3, no focal deficits.  EKG, labs, CXR.   Brian Hatchet, PA-C 05/07/22 0548    Brian Hatchet, PA-C 05/07/22 [id number]

## 2022-05-20 NOTE — Patient Instructions (Signed)
Thank you, [name] for allowing [place] to provide your care today. Today we discussed your high blood pressure.    Continue the amlodipine 10 mg daily Start Losartan 25 mg daily Continue to measure your blood pressure at home - our goal is to get you with a blood pressure less than 130/90 Pay attention to how much salt or sodium you consume: limit your sodium intake to less 2grams per day  I have ordered the following labs for you:   Lab Orders         Hemoglobin A1c         Lipid Profile      I will call if any are abnormal. All of your labs can be accessed through "My Chart".   My Chart Access: [email or web address]  Please follow-up in in 6 weeks.  Please make sure to arrive 15 minutes prior to your next appointment. If you arrive late, you may be asked to reschedule.    We look forward to seeing you next time. Please call our clinic at [phone] if you have any questions or concerns. The best time to call is Monday-Friday from 9am-4pm, but there is someone available 24/7. If after hours or the weekend, call the main hospital number and ask for the Internal Medicine Resident On-Call. If you need medication refills, please notify your pharmacy one week in advance and they will send [place] a request.   Thank you for letting us take part in your care. Wishing you the best!  Morene Crocker, MD 05/21/2022, 4:00 PM [facility] Internal Medicine Resident, PGY-1

## 2022-05-21 ENCOUNTER — Ambulatory Visit (INDEPENDENT_AMBULATORY_CARE_PROVIDER_SITE_OTHER): Payer: Self-pay | Admitting: Student

## 2022-05-21 ENCOUNTER — Other Ambulatory Visit (HOSPITAL_COMMUNITY): Payer: No Payment, Other

## 2022-05-21 ENCOUNTER — Other Ambulatory Visit (HOSPITAL_COMMUNITY): Payer: Self-pay

## 2022-05-21 VITALS — BP 153/79 | HR 74 | Temp 97.9°F | Wt 285.7 lb

## 2022-05-21 DIAGNOSIS — F319 Bipolar disorder, unspecified: Secondary | ICD-10-CM

## 2022-05-21 DIAGNOSIS — Z131 Encounter for screening for diabetes mellitus: Secondary | ICD-10-CM

## 2022-05-21 DIAGNOSIS — Z87891 Personal history of nicotine dependence: Secondary | ICD-10-CM

## 2022-05-21 DIAGNOSIS — Z13220 Encounter for screening for lipoid disorders: Secondary | ICD-10-CM | POA: Insufficient documentation

## 2022-05-21 DIAGNOSIS — I10 Essential (primary) hypertension: Secondary | ICD-10-CM | POA: Insufficient documentation

## 2022-05-21 MED ORDER — LOSARTAN POTASSIUM 25 MG PO TABS
25.0000 mg | ORAL_TABLET | Freq: Every day | ORAL | 3 refills | Status: DC
  Filled 2022-05-21: qty 90, 90d supply, fill #0

## 2022-05-21 MED ORDER — AMLODIPINE BESYLATE 5 MG PO TABS
5.0000 mg | ORAL_TABLET | Freq: Every day | ORAL | 0 refills | Status: DC
  Filled 2022-05-21 – 2022-05-22 (×2): qty 30, 30d supply, fill #0

## 2022-05-21 NOTE — Assessment & Plan Note (Signed)
Patient presented to the [facility] ED for atypical chest pain, for which he was worked up with normal EKG and negative troponins and found to have a blood pressure of 172/80. He was discharged on Amlodipine 5 mg daily, which he reports compliance. He also brought his BP log and his SBPs have ranged between 140-160 for the past two weeks. He has also made life style modifications like lowering his salt and sodium intake, increasing fiber rich foods, and increasing physical activity.  He denies CP, shortness of breath at rest or on exertion, changes in vision, dizziness, lightheadedness or palpitations today.   Physical exam is unremarkable, without evidence of LE edema. Vitals:   05/21/22 1501  BP: (!) 153/79   Patient is still above goal and would benefit of the addition of a second agent. BMP 2 weeks ago within normal levels. Plan: -Continue Amlodipine 5 mg daily -Start Losartan 25 mg daily -Continue daily BP log -Continue lifestyle modifications
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
## 2022-05-21 NOTE — Assessment & Plan Note (Signed)
No lipid panel for 10 years -lipid panel today

## 2022-05-21 NOTE — Assessment & Plan Note (Signed)
No polydipsia, polyuria, changes in vision, altered mental status. Has modified lifestyle since ED visit for hypertension with atypical chest pain -A1c today

## 2022-05-21 NOTE — Assessment & Plan Note (Signed)
Patient is managed by [facility] [facility]. Has had depresive episodes and is currently improving per patient. -Continue Zoloft 50 mg daily -Continue Abilify 5 mg daily

## 2022-05-22 ENCOUNTER — Telehealth: Payer: Self-pay | Admitting: Student

## 2022-05-22 ENCOUNTER — Other Ambulatory Visit (HOSPITAL_COMMUNITY): Payer: Self-pay

## 2022-05-22 ENCOUNTER — Other Ambulatory Visit: Payer: Self-pay | Admitting: Student

## 2022-05-22 DIAGNOSIS — I10 Essential (primary) hypertension: Secondary | ICD-10-CM

## 2022-05-22 LAB — LIPID PANEL
Chol/HDL Ratio: 4.2 ratio (ref 0.0–5.0)
Cholesterol, Total: 213 mg/dL — ABNORMAL HIGH (ref 100–199)
HDL: 51 mg/dL (ref >39)
LDL Chol Calc (NIH): 131 mg/dL — ABNORMAL HIGH (ref 0–99)
Triglycerides: 173 mg/dL — ABNORMAL HIGH (ref 0–149)
VLDL Cholesterol Cal: 31 mg/dL (ref 5–40)

## 2022-05-22 LAB — HEMOGLOBIN A1C
Est. average glucose Bld gHb Est-mCnc: 111 mg/dL
Hgb A1c MFr Bld: 5.5 % (ref 4.8–5.6)

## 2022-05-22 MED ORDER — AMLODIPINE BESYLATE 5 MG PO TABS
5.0000 mg | ORAL_TABLET | Freq: Every day | ORAL | 3 refills | Status: DC
  Filled 2022-05-22 – 2022-06-03 (×2): qty 90, 90d supply, fill #0

## 2022-05-22 NOTE — Progress Notes (Signed)
Patient called and aware of normal A1c and elevated t cholesterol and LDL. ASCVD score of 17.4%. Patient received education about statin therapy. Will plan to get liver function test and start moderate statin therapy at next OV

## 2022-05-22 NOTE — Progress Notes (Signed)
Internal Medicine Clinic Attending  I saw and evaluated the patient.  I personally confirmed the key portions of the history and exam documented by Dr. Gomez-Caraballo and I reviewed pertinent patient test results.  The assessment, diagnosis, and plan were formulated together and I agree with the documentation in the resident's note.  

## 2022-05-22 NOTE — Telephone Encounter (Signed)
Pt seen yesterday by 05/21/2022 and forgot to ask for the following medication to be filled  amLODipine (NORVASC) 5 MG tablet   [facility] [place]

## 2022-05-22 NOTE — Telephone Encounter (Signed)
#  [phone] sent yesterday to [facility]. Left message on patient's VM that Rx was sent yesterday.

## 2022-06-03 ENCOUNTER — Other Ambulatory Visit (HOSPITAL_COMMUNITY): Payer: Self-pay

## 2022-06-04 ENCOUNTER — Other Ambulatory Visit (HOSPITAL_COMMUNITY): Payer: Self-pay

## 2022-06-05 ENCOUNTER — Other Ambulatory Visit (HOSPITAL_COMMUNITY): Payer: Self-pay

## 2022-06-05 MED ORDER — SERTRALINE HCL 50 MG PO TABS
50.0000 mg | ORAL_TABLET | Freq: Every day | ORAL | 2 refills | Status: DC
  Filled 2022-06-05 – 2022-07-01 (×2): qty 30, 30d supply, fill #0

## 2022-06-05 MED ORDER — ARIPIPRAZOLE 5 MG PO TABS
5.0000 mg | ORAL_TABLET | Freq: Every day | ORAL | 1 refills | Status: DC
  Filled 2022-06-05: qty 30, 30d supply, fill #0
  Filled 2022-07-01: qty 30, 30d supply, fill #1

## 2022-06-05 MED ORDER — SERTRALINE HCL 50 MG PO TABS
50.0000 mg | ORAL_TABLET | Freq: Every day | ORAL | 1 refills | Status: DC
  Filled 2022-06-05: qty 30, 30d supply, fill #0

## 2022-06-06 ENCOUNTER — Other Ambulatory Visit (HOSPITAL_COMMUNITY): Payer: Self-pay

## 2022-07-01 ENCOUNTER — Other Ambulatory Visit (HOSPITAL_COMMUNITY): Payer: Self-pay

## 2022-07-02 ENCOUNTER — Other Ambulatory Visit (HOSPITAL_COMMUNITY): Payer: Self-pay

## 2022-07-02 ENCOUNTER — Other Ambulatory Visit: Payer: Self-pay

## 2022-07-02 ENCOUNTER — Ambulatory Visit: Payer: Medicaid Other | Admitting: Student

## 2022-07-02 ENCOUNTER — Encounter: Payer: Self-pay | Admitting: Student

## 2022-07-02 VITALS — BP 155/78 | HR 72 | Temp 98.2°F | Ht 70.0 in | Wt 272.4 lb

## 2022-07-02 DIAGNOSIS — Z1159 Encounter for screening for other viral diseases: Secondary | ICD-10-CM | POA: Insufficient documentation

## 2022-07-02 DIAGNOSIS — Z87891 Personal history of nicotine dependence: Secondary | ICD-10-CM | POA: Diagnosis not present

## 2022-07-02 DIAGNOSIS — Z1211 Encounter for screening for malignant neoplasm of colon: Secondary | ICD-10-CM

## 2022-07-02 DIAGNOSIS — I10 Essential (primary) hypertension: Secondary | ICD-10-CM

## 2022-07-02 DIAGNOSIS — Z114 Encounter for screening for human immunodeficiency virus [HIV]: Secondary | ICD-10-CM

## 2022-07-02 DIAGNOSIS — E782 Mixed hyperlipidemia: Secondary | ICD-10-CM

## 2022-07-02 DIAGNOSIS — E785 Hyperlipidemia, unspecified: Secondary | ICD-10-CM | POA: Insufficient documentation

## 2022-07-02 MED ORDER — LOSARTAN POTASSIUM 50 MG PO TABS
50.0000 mg | ORAL_TABLET | Freq: Every day | ORAL | 3 refills | Status: DC
  Filled 2022-07-02 (×2): qty 90, 90d supply, fill #0
  Filled 2022-09-25: qty 90, 90d supply, fill #1
  Filled 2022-12-31: qty 90, 90d supply, fill #2
  Filled 2023-03-19: qty 90, 90d supply, fill #3

## 2022-07-02 MED ORDER — ATORVASTATIN CALCIUM 10 MG PO TABS
10.0000 mg | ORAL_TABLET | Freq: Every day | ORAL | 3 refills | Status: DC
  Filled 2022-07-02 (×2): qty 90, 90d supply, fill #0
  Filled 2022-09-25: qty 90, 90d supply, fill #1
  Filled 2022-12-18: qty 90, 90d supply, fill #2
  Filled 2023-03-18: qty 90, 90d supply, fill #3

## 2022-07-02 NOTE — Patient Instructions (Addendum)
Thank you, Mr.Andree Medlen for allowing [place] to provide your care today. Today we discussed your blood pressure, your cholesterol, and your healthcare maintenance  Blood pressure Increase the dose of your Losartan. Because you still have medication at home, take 2 pills from that bottle daily Once you run out, pick up the new prescription - take one pill daily (50 mg) Continue taking your BP at home, at the same time, before activity, and before eating or drinking caffeine/tea  Cholesterol You will start taking a medication called Atorvastatin - take 1 10 mg pill everyday  I have referred you to the gastroenterologist  for a colonoscopy - they will call you  I have ordered the following labs for you:  Lab Orders         CMP14 + Anion Gap         Hepatitis C Ab reflex to Quant PCR         HIV antibody (with reflex)       I will call if any are abnormal. All of your labs can be accessed through "My Chart".   My Chart Access: [email or web address]  Please follow-up in 8 weeks  Please make sure to arrive 15 minutes prior to your next appointment. If you arrive late, you may be asked to reschedule.    We look forward to seeing you next time. Please call our clinic at [phone] if you have any questions or concerns. The best time to call is Monday-Friday from 9am-4pm, but there is someone available 24/7. If after hours or the weekend, call the main hospital number and ask for the Internal Medicine Resident On-Call. If you need medication refills, please notify your pharmacy one week in advance and they will send [place] a request.   Thank you for letting us take part in your care. Wishing you the best!  Romana Juniper, MD 07/02/2022, 3:27 PM [facility] Internal Medicine Resident, PGY-1

## 2022-07-02 NOTE — Progress Notes (Signed)
Internal Medicine Clinic Attending  Case discussed with Dr. Altamease Oiler At the time of the visit.  We reviewed the resident's history and exam and pertinent patient test results.  I agree with the assessment, diagnosis, and plan of care documented in the resident's note.     It will be helpful to have baseline LFTs as we initiate statin therapy, but we don't need to monitor these levels. At next appointment, please check blood pressure & BMP, and assess how he's tolerating the statin.

## 2022-07-02 NOTE — Assessment & Plan Note (Signed)
Patient here for BP follow up. Patient was started on Losartan 25 mg in addition to amlodipine 5mg . 05/21/22 OV BP 153/79, Cr.  1.1 GFR , >60 K. 4. Since then, pt has been compliant with medication regimen, and continues to actively change lifestyle modifications. Home readings with SPB 130-138 for the past month. Diastolic readings wnl. Denies symptoms at this time.  Vitals:   07/02/22 1449 07/02/22 1512  BP: (!) 152/91 (!) 155/78  Home readings are better than OV readings, however, patient would benefit of escalation of therapy to bring BP at goal.  Plan: Increase losartan 25->50 mg Continue amlodipine 5 mg Continue lifestyle modifications F/u Renal function today

## 2022-07-02 NOTE — Assessment & Plan Note (Signed)
-  Hep C Ab with reflex

## 2022-07-02 NOTE — Assessment & Plan Note (Signed)
At last OV patient was screened with lipid panel: ASCVD score of 17.4% (Total cholesterol 213, LDL 131, HDL 51). Patient is a former smoker, obesity, on medications for high risk for metabolic side effects. No hx of inflammatory diseases, fhx of premature ASCVD, CKD. Discussed benefits vs risk of starting statin therapy; patient is amenable. Plan: -Medium intensity statin - atorvastatin 10 mg daily -LFTs today for baseline -Follow up in 8 weeks to monitor side effect profile and LFT response

## 2022-07-02 NOTE — Assessment & Plan Note (Signed)
Referred patient to GI for Colonoscopy.

## 2022-07-02 NOTE — Assessment & Plan Note (Signed)
HIV screening today 
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
## 2022-07-03 ENCOUNTER — Encounter: Payer: Self-pay | Admitting: Student

## 2022-07-03 LAB — CMP14 + ANION GAP
ALT: 33 IU/L (ref 0–44)
AST: 21 IU/L (ref 0–40)
Albumin/Globulin Ratio: 2.2 (ref 1.2–2.2)
Albumin: 4.7 g/dL (ref 3.9–4.9)
Alkaline Phosphatase: 80 IU/L (ref 44–121)
Anion Gap: 15 mmol/L (ref 10.0–18.0)
BUN/Creatinine Ratio: 10 (ref 10–24)
BUN: 11 mg/dL (ref 8–27)
Bilirubin Total: 0.3 mg/dL (ref 0.0–1.2)
CO2: 22 mmol/L (ref 20–29)
Calcium: 9.4 mg/dL (ref 8.6–10.2)
Chloride: 107 mmol/L — ABNORMAL HIGH (ref 96–106)
Creatinine, Ser: 1.12 mg/dL (ref 0.76–1.27)
Globulin, Total: 2.1 g/dL (ref 1.5–4.5)
Glucose: 93 mg/dL (ref 70–99)
Potassium: 4.4 mmol/L (ref 3.5–5.2)
Sodium: 144 mmol/L (ref 134–144)
Total Protein: 6.8 g/dL (ref 6.0–8.5)
eGFR: 73 mL/min/{1.73_m2} (ref >59)

## 2022-07-03 LAB — HCV INTERPRETATION

## 2022-07-03 LAB — HIV ANTIBODY (ROUTINE TESTING W REFLEX): HIV Screen 4th Generation wRfx: NONREACTIVE

## 2022-07-03 LAB — HCV AB W REFLEX TO QUANT PCR: HCV Ab: NONREACTIVE

## 2022-07-03 NOTE — Progress Notes (Signed)
Patient called, patient aware of normal renal and liver function tests.  Screening tests HCV and HIV nonreactive

## 2022-07-11 ENCOUNTER — Telehealth: Payer: Self-pay | Admitting: Gastroenterology

## 2022-07-11 NOTE — Telephone Encounter (Signed)
HI Dr. Bryan Lemma,   Supervising Provider: 07/05/22  We received a referral for patient to have another colonoscopy. He does have GI history with [facility]. Records were obtained for you to review and advise on scheduling.   Thanks

## 2022-07-22 NOTE — Telephone Encounter (Signed)
Called patient to advise and schedule he said he will call back.

## 2022-07-22 NOTE — Telephone Encounter (Signed)
Records received and reviewed and notable for the following:  - 10/21/2012: Colonoscopy (Dr. Michail Sermon): 3 adenomas ranging 8-15 mm removed from descending colon and splenic flexure with hot snare, 3 mm semisessile adenoma removed from the distal descending colon.  Internal hemorrhoids.  Normal TI.  Recommended repeat in 3 years  Based on available records, does not appear to be prescribed any anticoagulation or antiplatelet therapy.   Ok to schedule for direct access colonoscopy with me for ongoing polyp surveillance.

## 2022-08-08 ENCOUNTER — Encounter (HOSPITAL_COMMUNITY): Payer: Medicare Other | Admitting: Psychiatry

## 2022-08-27 ENCOUNTER — Other Ambulatory Visit (HOSPITAL_COMMUNITY): Payer: Self-pay

## 2022-08-27 ENCOUNTER — Encounter: Payer: Self-pay | Admitting: Student

## 2022-08-27 ENCOUNTER — Other Ambulatory Visit: Payer: Self-pay

## 2022-08-27 ENCOUNTER — Ambulatory Visit (INDEPENDENT_AMBULATORY_CARE_PROVIDER_SITE_OTHER): Payer: 59

## 2022-08-27 ENCOUNTER — Ambulatory Visit (INDEPENDENT_AMBULATORY_CARE_PROVIDER_SITE_OTHER): Payer: 59 | Admitting: Student

## 2022-08-27 VITALS — BP 135/76 | HR 66 | Temp 98.0°F | Ht 69.5 in | Wt 272.8 lb

## 2022-08-27 DIAGNOSIS — I10 Essential (primary) hypertension: Secondary | ICD-10-CM | POA: Diagnosis not present

## 2022-08-27 DIAGNOSIS — F319 Bipolar disorder, unspecified: Secondary | ICD-10-CM

## 2022-08-27 DIAGNOSIS — R109 Unspecified abdominal pain: Secondary | ICD-10-CM

## 2022-08-27 DIAGNOSIS — E785 Hyperlipidemia, unspecified: Secondary | ICD-10-CM | POA: Diagnosis not present

## 2022-08-27 DIAGNOSIS — Z0000 Encounter for general adult medical examination without abnormal findings: Secondary | ICD-10-CM

## 2022-08-27 MED ORDER — SERTRALINE HCL 50 MG PO TABS: 50.0000 mg | ORAL_TABLET | Freq: Every day | ORAL | 2 refills | Status: DC

## 2022-08-27 MED ORDER — AMLODIPINE BESYLATE 10 MG PO TABS
10.0000 mg | ORAL_TABLET | Freq: Every day | ORAL | 3 refills | Status: DC
  Filled 2022-08-27: qty 90, 90d supply, fill #0
  Filled 2022-11-20: qty 90, 90d supply, fill #1
  Filled 2023-02-18 (×2): qty 90, 90d supply, fill #2
  Filled 2023-05-19: qty 90, 90d supply, fill #3

## 2022-08-27 MED ORDER — ARIPIPRAZOLE 5 MG PO TABS: 5.0000 mg | ORAL_TABLET | Freq: Every day | ORAL | 1 refills | Status: DC

## 2022-08-27 NOTE — Patient Instructions (Signed)
Health Maintenance, Male Adopting a healthy lifestyle and getting preventive care are important in promoting health and wellness. Ask your health care provider about: The right schedule for you to have regular tests and exams. Things you can do on your own to prevent diseases and keep yourself healthy. What should I know about diet, weight, and exercise? Eat a healthy diet  Eat a diet that includes plenty of vegetables, fruits, low-fat dairy products, and lean protein. Do not eat a lot of foods that are high in solid fats, added sugars, or sodium. Maintain a healthy weight Body mass index (BMI) is a measurement that can be used to identify possible weight problems. It estimates body fat based on height and weight. Your health care provider can help determine your BMI and help you achieve or maintain a healthy weight. Get regular exercise Get regular exercise. This is one of the most important things you can do for your health. Most adults should: Exercise for at least 150 minutes each week. The exercise should increase your heart rate and make you sweat (moderate-intensity exercise). Do strengthening exercises at least twice a week. This is in addition to the moderate-intensity exercise. Spend less time sitting. Even light physical activity can be beneficial. Watch cholesterol and blood lipids Have your blood tested for lipids and cholesterol at 65 years of age, then have this test every 5 years. You may need to have your cholesterol levels checked more often if: Your lipid or cholesterol levels are high. You are older than 65 years of age. You are at high risk for heart disease. What should I know about cancer screening? Many types of cancers can be detected early and may often be prevented. Depending on your health history and family history, you may need to have cancer screening at various ages. This may include screening for: Colorectal cancer. Prostate cancer. Skin cancer. Lung  cancer. What should I know about heart disease, diabetes, and high blood pressure? Blood pressure and heart disease High blood pressure causes heart disease and increases the risk of stroke. This is more likely to develop in people who have high blood pressure readings or are overweight. Talk with your health care provider about your target blood pressure readings. Have your blood pressure checked: Every 3-5 years if you are 18-39 years of age. Every year if you are 40 years old or older. If you are between the ages of 65 and 75 and are a current or former smoker, ask your health care provider if you should have a one-time screening for abdominal aortic aneurysm (AAA). Diabetes Have regular diabetes screenings. This checks your fasting blood sugar level. Have the screening done: Once every three years after age 45 if you are at a normal weight and have a low risk for diabetes. More often and at a younger age if you are overweight or have a high risk for diabetes. What should I know about preventing infection? Hepatitis B If you have a higher risk for hepatitis B, you should be screened for this virus. Talk with your health care provider to find out if you are at risk for hepatitis B infection. Hepatitis C Blood testing is recommended for: Everyone born from 1945 through 1965. Anyone with known risk factors for hepatitis C. Sexually transmitted infections (STIs) You should be screened each year for STIs, including gonorrhea and chlamydia, if: You are sexually active and are younger than 65 years of age. You are older than 65 years of age and your   health care provider tells you that you are at risk for this type of infection. Your sexual activity has changed since you were last screened, and you are at increased risk for chlamydia or gonorrhea. Ask your health care provider if you are at risk. Ask your health care provider about whether you are at high risk for HIV. Your health care provider  may recommend a prescription medicine to help prevent HIV infection. If you choose to take medicine to prevent HIV, you should first get tested for HIV. You should then be tested every 3 months for as long as you are taking the medicine. Follow these instructions at home: Alcohol use Do not drink alcohol if your health care provider tells you not to drink. If you drink alcohol: Limit how much you have to 0-2 drinks a day. Know how much alcohol is in your drink. In the [place] one drink equals one 12 oz bottle of beer (355 mL), one 5 oz glass of wine (148 mL), or one 1 oz glass of hard liquor (44 mL). Lifestyle Do not use any products that contain nicotine or tobacco. These products include cigarettes, chewing tobacco, and vaping devices, such as e-cigarettes. If you need help quitting, ask your health care provider. Do not use street drugs. Do not share needles. Ask your health care provider for help if you need support or information about quitting drugs. General instructions Schedule regular health, dental, and eye exams. Stay current with your vaccines. Tell your health care provider if: You often feel depressed. You have ever been abused or do not feel safe at home. Summary Adopting a healthy lifestyle and getting preventive care are important in promoting health and wellness. Follow your health care provider's instructions about healthy diet, exercising, and getting tested or screened for diseases. Follow your health care provider's instructions on monitoring your cholesterol and blood pressure. This information is not intended to replace advice given to you by your health care provider. Make sure you discuss any questions you have with your health care provider. Document Revised: 10/23/2020 Document Reviewed: 10/23/2020 Elsevier Patient Education  2023 [employer] Inc[employer]  

## 2022-08-27 NOTE — Patient Instructions (Addendum)
You BP is close to normal Please increase amlodipine to 10 mg daily Increasing amlodipine may cause increased leg swelling. if this becomes bothersome to please call our clinic and we can adjust her blood pressure medicines  We will check your renal function and potassium today and I will call you with the results  Please follow-up your blood pressure in about 3 months
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
## 2022-08-28 LAB — BMP8+ANION GAP
Anion Gap: 14 mmol/L (ref 10.0–18.0)
BUN/Creatinine Ratio: 20 (ref 10–24)
BUN: 19 mg/dL (ref 8–27)
CO2: 22 mmol/L (ref 20–29)
Calcium: 9.1 mg/dL (ref 8.6–10.2)
Chloride: 106 mmol/L (ref 96–106)
Creatinine, Ser: 0.96 mg/dL (ref 0.76–1.27)
Glucose: 96 mg/dL (ref 70–99)
Potassium: 4.1 mmol/L (ref 3.5–5.2)
Sodium: 142 mmol/L (ref 134–144)
eGFR: 88 mL/min/{1.73_m2} (ref >59)

## 2022-08-29 ENCOUNTER — Other Ambulatory Visit (HOSPITAL_COMMUNITY): Payer: Self-pay

## 2022-08-29 DIAGNOSIS — R109 Unspecified abdominal pain: Secondary | ICD-10-CM | POA: Insufficient documentation

## 2022-08-29 MED ORDER — PEG 3350-KCL-NA BICARB-NACL 420 G PO SOLR
ORAL | 0 refills | Status: DC
  Filled 2022-08-29: qty 4000, 1d supply, fill #0

## 2022-08-29 NOTE — Progress Notes (Signed)
Established Patient Office Visit  Subjective   Patient ID: Brian Baldwin, male    DOB: June 15, 1956  Age: 67 y.o. MRN: [id number]  Chief Complaint  Patient presents with   Follow-up    2 month f/u for BP and Hypercholerolemia    Brian Baldwin is a 67 y.o. person living with a history listed below who presents to clinic for BP follow up. Please refer to problem based charting for further details and assessment and plan of current problem and chronic medical conditions.     Patient Active Problem List   Diagnosis Date Noted   Abdominal pain 08/29/2022   Hyperlipidemia 07/02/2022   Encounter for colorectal cancer screening 07/02/2022   Need for hepatitis C screening test 07/02/2022   Encounter for screening for HIV 07/02/2022   Hypertension 05/21/2022   Need for lipid screening 05/21/2022   Screening for diabetes mellitus (DM) 05/21/2022   Bipolar I disorder ([place]) 01/06/2020   MDD (major depressive disorder) 01/06/2020      Review of Systems  Constitutional:  Negative for chills and fever.  Respiratory:  Negative for sputum production and shortness of breath.   Cardiovascular:  Negative for chest pain and palpitations.  Gastrointestinal:  Positive for abdominal pain. Negative for blood in stool, constipation, diarrhea, melena, nausea and vomiting.  All other systems reviewed and are negative.    Objective:     BP 135/76 (BP Location: Right Arm, Cuff Size: Large)   Pulse 66   Temp 98 F (36.7 C) (Oral)   Ht 5' 9.5" (1.765 m)   Wt 272 lb 12.8 oz (123.7 kg)   SpO2 98%   BMI 39.71 kg/m  BP Readings from Last 3 Encounters:  08/27/22 135/76  08/27/22 135/76  07/02/22 (!) 155/78      Physical Exam Constitutional:      Appearance: Normal appearance.  HENT:     Mouth/Throat:     Mouth: Mucous membranes are moist.     Pharynx: Oropharynx is clear.  Eyes:     Extraocular Movements: Extraocular movements intact.     Conjunctiva/sclera: Conjunctivae normal.      Pupils: Pupils are equal, round, and reactive to light.  Cardiovascular:     Rate and Rhythm: Normal rate and regular rhythm.     Heart sounds: No murmur heard. Pulmonary:     Effort: Pulmonary effort is normal.     Breath sounds: No rhonchi or rales.  Abdominal:     General: Abdomen is flat. Bowel sounds are normal. There is no distension.     Palpations: Abdomen is soft. There is no mass.     Tenderness: There is no abdominal tenderness. There is no right CVA tenderness, left CVA tenderness or guarding.     Hernia: No hernia is present.  Musculoskeletal:        General: Normal range of motion.     Right lower leg: No edema.     Left lower leg: No edema.  Skin:    General: Skin is warm and dry.     Capillary Refill: Capillary refill takes less than 2 seconds.  Neurological:     General: No focal deficit present.     Mental Status: He is alert and oriented to person, place, and time.  Psychiatric:        Mood and Affect: Mood normal.        Behavior: Behavior normal.      Results for orders placed or performed in visit on 08/27/22  BMP8+Anion Gap  Result Value Ref Range   Glucose 96 70 - 99 mg/dL   BUN 19 8 - 27 mg/dL   Creatinine, Ser 0.96 0.76 - 1.27 mg/dL   eGFR 88 >59 mL/min/1.73   BUN/Creatinine Ratio 20 10 - 24   Sodium 142 134 - 144 mmol/L   Potassium 4.1 3.5 - 5.2 mmol/L   Chloride 106 96 - 106 mmol/L   CO2 22 20 - 29 mmol/L   Anion Gap 14.0 10.0 - 18.0 mmol/L   Calcium 9.1 8.6 - 10.2 mg/dL    Last metabolic panel Lab Results  Component Value Date   GLUCOSE 96 08/27/2022   NA 142 08/27/2022   K 4.1 08/27/2022   CL 106 08/27/2022   CO2 22 08/27/2022   BUN 19 08/27/2022   CREATININE 0.96 08/27/2022   EGFR 88 08/27/2022   CALCIUM 9.1 08/27/2022   PROT 6.8 07/02/2022   ALBUMIN 4.7 07/02/2022   LABGLOB 2.1 07/02/2022   AGRATIO 2.2 07/02/2022   BILITOT 0.3 07/02/2022   ALKPHOS 80 07/02/2022   AST 21 07/02/2022   ALT 33 07/02/2022   ANIONGAP 11  05/07/2022      The 10-year ASCVD risk score (Arnett DK, et al., 2019) is: 16.6%    Assessment & Plan:   Problem List Items Addressed This Visit       Cardiovascular and Mediastinum   Hypertension    BP today 141/79 and 135/76 on repeat.  Adherent to losartan 50 mg a day in addition to amlodipine 5 mg daily.  BP improved from last visit but slightly above goal.  Patient is hoping to start working on the gym soon.  Increase amlodipine to 10 mg daily, if he has significant lower extremity edema from increasing amlodipine can go up on his losartan BMP today      Relevant Medications   amLODipine (NORVASC) 10 MG tablet   Other Relevant Orders   BMP8+Anion Gap (Completed)     Other   Bipolar I disorder ([place])    States he is no longer taking Zoloft or Abilify.  States he does not want to continue following up with [facility].  Denies symptoms of depression or mania today.  Discussed that if he were to develop worsening symptoms that he should follow-up with psychiatry.      Hyperlipidemia - Primary    Tolerating atorvastatin 10 mg daily.  He is concerned that atorvastatin may worsen his renal function or potassium.  Discussed that this is unlikely to happen on atorvastatin.  Will be checking BMP for increasing the dose of losartan at last visit.  Continue atorvastatin 10 mg daily      Relevant Medications   amLODipine (NORVASC) 10 MG tablet   Abdominal pain    Patient reports 3 days of mild discomfort in the left and right lateral abdomen.  He is concerned this is due to kidney dysfunction after starting atorvastatin.  He denies any changes in activity or trauma.  He denies any nausea, vomiting, diarrhea, constipation, melena, or hematochezia.  No urinary symptoms, fevers or chills.  On exam there is reproducible tenderness to deep palpation of the lateral mid abdomen bilaterally.  Pain seems focal without concerning signs of acute intra-abdominal process.  I have  asked him to monitor his symptoms and follow-up if pain were to worsen or fail to improve.  Reassured him that his pain is not consistent with kidney dysfunction or typical of statin usage.  Return in about 3 months (around 11/27/2022).    Brian Beard, MD

## 2022-08-29 NOTE — Assessment & Plan Note (Signed)
Tolerating atorvastatin 10 mg daily.  He is concerned that atorvastatin may worsen his renal function or potassium.  Discussed that this is unlikely to happen on atorvastatin.  Will be checking BMP for increasing the dose of losartan at last visit.  Continue atorvastatin 10 mg daily

## 2022-08-29 NOTE — Assessment & Plan Note (Signed)
States he is no longer taking Zoloft or Abilify.  States he does not want to continue following up with [facility].  Denies symptoms of depression or mania today.  Discussed that if he were to develop worsening symptoms that he should follow-up with psychiatry.

## 2022-08-29 NOTE — Assessment & Plan Note (Signed)
BP today 141/79 and 135/76 on repeat.  Adherent to losartan 50 mg a day in addition to amlodipine 5 mg daily.  BP improved from last visit but slightly above goal.  Patient is hoping to start working on the gym soon.  Increase amlodipine to 10 mg daily, if he has significant lower extremity edema from increasing amlodipine can go up on his losartan BMP today

## 2022-08-29 NOTE — Assessment & Plan Note (Signed)
Patient reports 3 days of mild discomfort in the left and right lateral abdomen.  He is concerned this is due to kidney dysfunction after starting atorvastatin.  He denies any changes in activity or trauma.  He denies any nausea, vomiting, diarrhea, constipation, melena, or hematochezia.  No urinary symptoms, fevers or chills.  On exam there is reproducible tenderness to deep palpation of the lateral mid abdomen bilaterally.  Pain seems focal without concerning signs of acute intra-abdominal process.  I have asked him to monitor his symptoms and follow-up if pain were to worsen or fail to improve.  Reassured him that his pain is not consistent with kidney dysfunction or typical of statin usage.

## 2022-09-02 NOTE — Progress Notes (Signed)
Internal Medicine Clinic Attending ? ?Case discussed with Dr. Liang  At the time of the visit.  We reviewed the resident?s history and exam and pertinent patient test results.  I agree with the assessment, diagnosis, and plan of care documented in the resident?s note. ? ?

## 2022-09-05 NOTE — Progress Notes (Signed)
I reviewed the AWV findings with the provider who conducted the visit. I was present in the office suite and immediately available to provide assistance and direction throughout the time the service was provided.  

## 2022-09-17 NOTE — Telephone Encounter (Signed)
Patient need OV to look at rash on groin. States it not getting better with OTC creams.

## 2022-09-24 DIAGNOSIS — K648 Other hemorrhoids: Secondary | ICD-10-CM | POA: Diagnosis not present

## 2022-09-24 DIAGNOSIS — Z09 Encounter for follow-up examination after completed treatment for conditions other than malignant neoplasm: Secondary | ICD-10-CM | POA: Diagnosis not present

## 2022-09-24 DIAGNOSIS — D124 Benign neoplasm of descending colon: Secondary | ICD-10-CM | POA: Diagnosis not present

## 2022-09-24 DIAGNOSIS — D125 Benign neoplasm of sigmoid colon: Secondary | ICD-10-CM | POA: Diagnosis not present

## 2022-09-24 DIAGNOSIS — Z86010 Personal history of colonic polyps: Secondary | ICD-10-CM | POA: Diagnosis not present

## 2022-09-24 DIAGNOSIS — K5730 Diverticulosis of large intestine without perforation or abscess without bleeding: Secondary | ICD-10-CM | POA: Diagnosis not present

## 2022-09-25 ENCOUNTER — Other Ambulatory Visit (HOSPITAL_COMMUNITY): Payer: Self-pay

## 2022-09-26 ENCOUNTER — Other Ambulatory Visit (HOSPITAL_COMMUNITY): Payer: Self-pay

## 2022-09-26 DIAGNOSIS — D125 Benign neoplasm of sigmoid colon: Secondary | ICD-10-CM | POA: Diagnosis not present

## 2022-09-26 DIAGNOSIS — D124 Benign neoplasm of descending colon: Secondary | ICD-10-CM | POA: Diagnosis not present

## 2022-10-31 ENCOUNTER — Encounter: Payer: Self-pay | Admitting: Student

## 2022-11-20 ENCOUNTER — Other Ambulatory Visit (HOSPITAL_COMMUNITY): Payer: Self-pay

## 2022-12-02 ENCOUNTER — Ambulatory Visit (INDEPENDENT_AMBULATORY_CARE_PROVIDER_SITE_OTHER): Payer: 59

## 2022-12-02 VITALS — BP 146/78 | HR 58 | Temp 98.4°F | Ht 70.0 in | Wt 263.8 lb

## 2022-12-02 DIAGNOSIS — I10 Essential (primary) hypertension: Secondary | ICD-10-CM

## 2022-12-02 DIAGNOSIS — Z0000 Encounter for general adult medical examination without abnormal findings: Secondary | ICD-10-CM | POA: Insufficient documentation

## 2022-12-02 DIAGNOSIS — E785 Hyperlipidemia, unspecified: Secondary | ICD-10-CM

## 2022-12-02 NOTE — Assessment & Plan Note (Signed)
Patient has been on Atorvastatin 10mg  for 6 months for primary prevention given ASCVD 17.4% which is intermediate risk. Repeating lipid panel today, goal LDL <100. Will make medication adjustments as needed.

## 2022-12-02 NOTE — Assessment & Plan Note (Signed)
Discussed indication for shingles vaccine in this patient with history of chicken pox and ability to get this with a pharmacy appointment. Also discussed pneumonia vaccine administered in the clinic given his age and to prevent hospitalization and death related to S. pneumo pneumonia. Patient politely declining at this time and wanting to do more research. Will continue to discuss at follow up visit.

## 2022-12-02 NOTE — Progress Notes (Deleted)
HTN Losartan 50 Amlodipine 10 Bmp wnl 08/27/22  HLD LDL 131 05/21/2022 Atorva 10mg  10 year ASCVD 16.6  HCM: Tdap Zoster pneumonia

## 2022-12-02 NOTE — Progress Notes (Signed)
   Established Patient Office Visit  Subjective   Patient ID: Brian Baldwin, male    DOB: 08-08-1955  Age: 67 y.o. MRN: [id number]  Chief Complaint  Patient presents with   Hypertension   Follow-up    Brian Baldwin is a 67 y/o male with a pmh outlined below. Please see A&P for HPI information.  Hypertension      Review of Systems  All other systems reviewed and are negative.     Objective:     BP (!) 146/78 (BP Location: Left Arm, Patient Position: Sitting, Cuff Size: Small)   Pulse (!) 58   Temp 98.4 F (36.9 C) (Oral)   Ht 5\' 10"  (1.778 m)   Wt 263 lb 12.8 oz (119.7 kg)   SpO2 95%   BMI 37.85 kg/m    Physical Exam Constitutional:      General: He is not in acute distress.    Appearance: Normal appearance. He is obese. He is not ill-appearing.  Cardiovascular:     Rate and Rhythm: Normal rate and regular rhythm.     Pulses: Normal pulses.     Heart sounds: Normal heart sounds. No murmur heard.    No gallop.  Pulmonary:     Effort: Pulmonary effort is normal. No respiratory distress.     Breath sounds: Normal breath sounds. No wheezing or rales.  Musculoskeletal:     Right lower leg: No edema.     Left lower leg: No edema.  Skin:    General: Skin is warm and dry.  Neurological:     Mental Status: He is alert.      No results found for any visits on 12/02/22.    The 10-year ASCVD risk score (Arnett DK, et al., 2019) is: 18.9%    Assessment & Plan:   Problem List Items Addressed This Visit       Cardiovascular and Mediastinum   Hypertension    Patient presented for 3 month BP follow up. He has logged BP daily with readings taken from AM,mid-day, PM and average is systolic 120s. Tolerating medications well and annual BMP on ARB 08/2022 was normal. Denies lightheadedness/dizzines, headaches, vision changes, CP,SOB, LE edema. Can follow up in 1 year and continue losartan 50mg  and amlodipine 10mg .        Other   Hyperlipidemia - Primary    Patient  has been on Atorvastatin 10mg  for 6 months for primary prevention given ASCVD 17.4% which is intermediate risk. Repeating lipid panel today, goal LDL <100. Will make medication adjustments as needed.      Relevant Orders   Lipid Profile   Healthcare maintenance    Discussed indication for shingles vaccine in this patient with history of chicken pox and ability to get this with a pharmacy appointment. Also discussed pneumonia vaccine administered in the clinic given his age and to prevent hospitalization and death related to S. pneumo pneumonia. Patient politely declining at this time and wanting to do more research. Will continue to discuss at follow up visit.       Return in about 1 year (around 12/02/2023) for blood pressure.    Willette Cluster, MD

## 2022-12-02 NOTE — Assessment & Plan Note (Signed)
Patient presented for 3 month BP follow up. He has logged BP daily with readings taken from AM,mid-day, PM and average is systolic 120s. Tolerating medications well and annual BMP on ARB 08/2022 was normal. Denies lightheadedness/dizzines, headaches, vision changes, CP,SOB, LE edema. Can follow up in 1 year and continue losartan 50mg  and amlodipine 10mg .

## 2022-12-02 NOTE — Patient Instructions (Signed)
Thank you, [name] for allowing [place] to provide your care today. Today we discussed :  Blood pressure: Your blood pressure looks great! Continue taking your medications as indicated. We will see you in 1 year to recheck your kidney function and electrolytes.  High cholesterol: We will check your cholesterol today and adjust medications as needed.  Vaccination: We discussed the shingles vaccine today. You should make an appointment with your pharmacy to get this if you decide to do so. We also discussed the pneumonia vaccine. You can get that here at the clinic if you decide to do so.   I have ordered the following labs for you:   Lab Orders         Lipid Profile       Referrals ordered today:   Referral Orders  No referral(s) requested today     I have ordered the following medication/changed the following medications:   Stop the following medications: There are no discontinued medications.   Start the following medications: No orders of the defined types were placed in this encounter.    Follow up: 1 year     We look forward to seeing you next time. Please call our clinic at [phone] if you have any questions or concerns. The best time to call is Monday-Friday from 9am-4pm, but there is someone available 24/7. If after hours or the weekend, call the main hospital number and ask for the Internal Medicine Resident On-Call. If you need medication refills, please notify your pharmacy one week in advance and they will send [place] a request.   Thank you for trusting me with your care. Wishing you the best!   Willette Cluster, MD [facility]

## 2022-12-04 LAB — LIPID PANEL
Chol/HDL Ratio: 2.6 ratio (ref 0.0–5.0)
Cholesterol, Total: 158 mg/dL (ref 100–199)
HDL: 60 mg/dL (ref >39)
LDL Chol Calc (NIH): 70 mg/dL (ref 0–99)
Triglycerides: 167 mg/dL — ABNORMAL HIGH (ref 0–149)
VLDL Cholesterol Cal: 28 mg/dL (ref 5–40)

## 2022-12-05 NOTE — Progress Notes (Signed)
Called patient and discussed results. LDL at 70 and at goal of <100 for primary prevention. Can repeat lipid panel in 1 year.

## 2022-12-05 NOTE — Progress Notes (Signed)
Internal Medicine Clinic Attending  Case discussed with Dr. Rogers  At the time of the visit.  We reviewed the resident's history and exam and pertinent patient test results.  I agree with the assessment, diagnosis, and plan of care documented in the resident's note.  

## 2022-12-11 ENCOUNTER — Encounter: Payer: Self-pay | Admitting: Student

## 2022-12-18 ENCOUNTER — Other Ambulatory Visit (HOSPITAL_COMMUNITY): Payer: Self-pay

## 2022-12-31 ENCOUNTER — Other Ambulatory Visit (HOSPITAL_COMMUNITY): Payer: Self-pay

## 2022-12-31 ENCOUNTER — Other Ambulatory Visit (HOSPITAL_BASED_OUTPATIENT_CLINIC_OR_DEPARTMENT_OTHER): Payer: Self-pay

## 2023-01-01 ENCOUNTER — Other Ambulatory Visit (HOSPITAL_COMMUNITY): Payer: Self-pay

## 2023-02-18 ENCOUNTER — Other Ambulatory Visit (HOSPITAL_COMMUNITY): Payer: Self-pay

## 2023-03-18 ENCOUNTER — Other Ambulatory Visit (HOSPITAL_COMMUNITY): Payer: Self-pay

## 2023-03-19 ENCOUNTER — Other Ambulatory Visit (HOSPITAL_COMMUNITY): Payer: Self-pay

## 2023-05-07 ENCOUNTER — Encounter: Payer: Self-pay | Admitting: Student

## 2023-05-12 ENCOUNTER — Encounter: Payer: Self-pay | Admitting: Student

## 2023-05-19 ENCOUNTER — Other Ambulatory Visit (HOSPITAL_COMMUNITY): Payer: Self-pay

## 2023-06-16 ENCOUNTER — Other Ambulatory Visit: Payer: Self-pay

## 2023-06-16 ENCOUNTER — Other Ambulatory Visit: Payer: Self-pay | Admitting: Student

## 2023-06-16 ENCOUNTER — Other Ambulatory Visit (HOSPITAL_COMMUNITY): Payer: Self-pay

## 2023-06-16 DIAGNOSIS — I10 Essential (primary) hypertension: Secondary | ICD-10-CM

## 2023-06-16 MED ORDER — LOSARTAN POTASSIUM 50 MG PO TABS
50.0000 mg | ORAL_TABLET | Freq: Every day | ORAL | 3 refills | Status: DC
  Filled 2023-06-16: qty 90, 90d supply, fill #0
  Filled 2023-08-25: qty 90, 90d supply, fill #1
  Filled 2023-11-17: qty 90, 90d supply, fill #2
  Filled 2024-02-17: qty 90, 90d supply, fill #0
  Filled 2024-02-17: qty 90, 90d supply, fill #3

## 2023-06-16 MED ORDER — ATORVASTATIN CALCIUM 10 MG PO TABS
10.0000 mg | ORAL_TABLET | Freq: Every day | ORAL | 0 refills | Status: DC
  Filled 2023-06-16: qty 90, 90d supply, fill #0

## 2023-06-16 NOTE — Telephone Encounter (Signed)
Rx refill request, I called the patient to schedule a fu appointment with [place] unable to reach the patient I lvm.

## 2023-06-17 ENCOUNTER — Other Ambulatory Visit (HOSPITAL_COMMUNITY): Payer: Self-pay

## 2023-06-27 ENCOUNTER — Other Ambulatory Visit: Payer: Self-pay | Admitting: Internal Medicine

## 2023-06-27 ENCOUNTER — Encounter: Payer: Self-pay | Admitting: Student

## 2023-06-27 ENCOUNTER — Ambulatory Visit (INDEPENDENT_AMBULATORY_CARE_PROVIDER_SITE_OTHER): Payer: 59 | Admitting: Internal Medicine

## 2023-06-27 ENCOUNTER — Other Ambulatory Visit (HOSPITAL_COMMUNITY): Payer: Self-pay

## 2023-06-27 ENCOUNTER — Other Ambulatory Visit: Payer: Self-pay

## 2023-06-27 ENCOUNTER — Encounter: Payer: Self-pay | Admitting: Internal Medicine

## 2023-06-27 ENCOUNTER — Other Ambulatory Visit: Payer: Self-pay | Admitting: Student

## 2023-06-27 VITALS — BP 149/73 | HR 59 | Temp 97.7°F | Ht 70.0 in | Wt 264.7 lb

## 2023-06-27 DIAGNOSIS — I10 Essential (primary) hypertension: Secondary | ICD-10-CM

## 2023-06-27 DIAGNOSIS — Z6837 Body mass index (BMI) 37.0-37.9, adult: Secondary | ICD-10-CM

## 2023-06-27 DIAGNOSIS — E66813 Obesity, class 3: Secondary | ICD-10-CM | POA: Insufficient documentation

## 2023-06-27 DIAGNOSIS — E669 Obesity, unspecified: Secondary | ICD-10-CM | POA: Diagnosis not present

## 2023-06-27 DIAGNOSIS — E785 Hyperlipidemia, unspecified: Secondary | ICD-10-CM | POA: Diagnosis not present

## 2023-06-27 DIAGNOSIS — R7309 Other abnormal glucose: Secondary | ICD-10-CM | POA: Diagnosis not present

## 2023-06-27 DIAGNOSIS — E782 Mixed hyperlipidemia: Secondary | ICD-10-CM | POA: Diagnosis not present

## 2023-06-27 NOTE — Assessment & Plan Note (Addendum)
 Lipid profile last checked 6 months ago, LDL 70. Patient is prescribed atorvastatin  10mg  which he has been taking every other day for the past several weeks. He is requesting a lipid profile today. Discussed ASCVD and his personal risk of 14.6% and the contribution of hyperlipidemia to risk for cardiovascular events.  - Lipid profile today

## 2023-06-27 NOTE — Assessment & Plan Note (Signed)
 BP today is 149/73. Patient came with a blood pressure log showing average systolic blood pressures in the mid 130s. Regimen includes losartan  50mg  and amlodipine  10mg . Patient is very motivated to come off medications. He has lost approximately 15 pounds and is very conscious about his diet. No changes to antihypertensive regimen today, will revisit topic at future visit.  - BMP today

## 2023-06-27 NOTE — Patient Instructions (Signed)
 Mr Plate,   I am testing your A1c, a lipid profile, and your electrolytes and kidney function today. I will send you a Mychart message about these results and if we need to make any medication adjustments.   We will plan to see you again in a year for a routine visit.   Thanks,  Dr Francella

## 2023-06-27 NOTE — Assessment & Plan Note (Signed)
 Patient has never had A1c tested. A1c today
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
## 2023-06-28 LAB — LIPID PANEL

## 2023-06-29 ENCOUNTER — Encounter: Payer: Self-pay | Admitting: Student

## 2023-06-29 LAB — LIPID PANEL
Cholesterol, Total: 196 mg/dL (ref 100–199)
HDL: 60 mg/dL (ref >39)
LDL CALC COMMENT:: 3.3 ratio (ref 0.0–5.0)
LDL Chol Calc (NIH): 114 mg/dL — ABNORMAL HIGH (ref 0–99)
Triglycerides: 123 mg/dL (ref 0–149)
VLDL Cholesterol Cal: 22 mg/dL (ref 5–40)

## 2023-06-29 LAB — BASIC METABOLIC PANEL
BUN/Creatinine Ratio: 12 (ref 10–24)
BUN: 14 mg/dL (ref 8–27)
CO2: 23 mmol/L (ref 20–29)
Calcium: 9.6 mg/dL (ref 8.6–10.2)
Chloride: 107 mmol/L — ABNORMAL HIGH (ref 96–106)
Creatinine, Ser: 1.14 mg/dL (ref 0.76–1.27)
Glucose: 96 mg/dL (ref 70–99)
Potassium: 4.7 mmol/L (ref 3.5–5.2)
Sodium: 144 mmol/L (ref 134–144)
eGFR: 71 mL/min/{1.73_m2} (ref >59)

## 2023-06-29 LAB — HEMOGLOBIN A1C
Est. average glucose Bld gHb Est-mCnc: 111 mg/dL
Hgb A1c MFr Bld: 5.5 % (ref 4.8–5.6)

## 2023-06-30 ENCOUNTER — Other Ambulatory Visit: Payer: Self-pay

## 2023-06-30 ENCOUNTER — Other Ambulatory Visit: Payer: Self-pay | Admitting: Student

## 2023-06-30 ENCOUNTER — Encounter: Payer: Self-pay | Admitting: Student

## 2023-06-30 ENCOUNTER — Other Ambulatory Visit (HOSPITAL_COMMUNITY): Payer: Self-pay

## 2023-06-30 DIAGNOSIS — E785 Hyperlipidemia, unspecified: Secondary | ICD-10-CM

## 2023-06-30 MED ORDER — ATORVASTATIN CALCIUM 10 MG PO TABS
10.0000 mg | ORAL_TABLET | Freq: Every day | ORAL | 11 refills | Status: DC
  Filled 2023-06-30: qty 30, 30d supply, fill #0

## 2023-06-30 MED ORDER — ATORVASTATIN CALCIUM 10 MG PO TABS
10.0000 mg | ORAL_TABLET | Freq: Every day | ORAL | 3 refills | Status: DC
  Filled 2023-06-30: qty 90, 90d supply, fill #0

## 2023-06-30 MED ORDER — AMLODIPINE BESYLATE 10 MG PO TABS
10.0000 mg | ORAL_TABLET | Freq: Every day | ORAL | 3 refills | Status: DC
  Filled 2023-06-30 – 2023-11-17 (×4): qty 90, 90d supply, fill #0
  Filled 2024-02-16: qty 90, 90d supply, fill #1
  Filled 2024-02-17: qty 90, 90d supply, fill #0
  Filled 2024-05-14 (×2): qty 90, 90d supply, fill #1

## 2023-06-30 MED ORDER — ATORVASTATIN CALCIUM 40 MG PO TABS
40.0000 mg | ORAL_TABLET | Freq: Every day | ORAL | 11 refills | Status: DC
  Filled 2023-06-30: qty 30, 30d supply, fill #0

## 2023-06-30 NOTE — Addendum Note (Signed)
 Addended by: Morene Crocker on: 06/30/2023 12:04 PM   Modules accepted: Orders

## 2023-07-04 ENCOUNTER — Other Ambulatory Visit (HOSPITAL_COMMUNITY): Payer: Self-pay

## 2023-07-04 ENCOUNTER — Other Ambulatory Visit: Payer: Self-pay | Admitting: Student

## 2023-07-04 DIAGNOSIS — E785 Hyperlipidemia, unspecified: Secondary | ICD-10-CM

## 2023-07-04 MED ORDER — ATORVASTATIN CALCIUM 10 MG PO TABS
10.0000 mg | ORAL_TABLET | Freq: Every day | ORAL | 3 refills | Status: DC
  Filled 2023-07-04 – 2023-08-25 (×2): qty 90, 90d supply, fill #0
  Filled 2023-11-17: qty 90, 90d supply, fill #1
  Filled 2024-02-17: qty 90, 90d supply, fill #0
  Filled 2024-02-17: qty 90, 90d supply, fill #2
  Filled 2024-05-14 (×2): qty 90, 90d supply, fill #1

## 2023-07-07 NOTE — Progress Notes (Signed)
Internal Medicine Clinic Attending  Case discussed with the resident at the time of the visit.  We reviewed the resident's history and exam and pertinent patient test results.  I agree with the assessment, diagnosis, and plan of care documented in the resident's note.    If he's been taking statin, I recommend increasing dose to get LDL to goal

## 2023-07-07 NOTE — Progress Notes (Signed)
See attestation from last [facility] note

## 2023-07-17 ENCOUNTER — Other Ambulatory Visit (HOSPITAL_COMMUNITY): Payer: Self-pay

## 2023-07-17 ENCOUNTER — Encounter: Payer: Self-pay | Admitting: Student

## 2023-08-13 ENCOUNTER — Other Ambulatory Visit (HOSPITAL_COMMUNITY): Payer: Self-pay

## 2023-08-25 ENCOUNTER — Other Ambulatory Visit (HOSPITAL_COMMUNITY): Payer: Self-pay

## 2023-11-17 ENCOUNTER — Other Ambulatory Visit (HOSPITAL_COMMUNITY): Payer: Self-pay

## 2023-12-03 ENCOUNTER — Ambulatory Visit

## 2023-12-03 VITALS — BP 132/83 | HR 55 | Ht 70.0 in | Wt 270.0 lb

## 2023-12-03 DIAGNOSIS — Z0000 Encounter for general adult medical examination without abnormal findings: Secondary | ICD-10-CM | POA: Diagnosis not present
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
## 2023-12-03 NOTE — Patient Instructions (Signed)
 Mr. Edmundson , Thank you for taking time out of your busy schedule to complete your Annual Wellness Visit with me. I enjoyed our conversation and look forward to speaking with you again next year. I, as well as your care team,  appreciate your ongoing commitment to your health goals. Please review the following plan we discussed and let me know if I can assist you in the future. Your Game plan/ To Do List    Referrals: If you haven't heard from the office you've been referred to, please reach out to them at the phone provided.  None made Follow up Visits: Next Medicare AWV with our clinical staff: patient declined    Have you seen your provider in the last 6 months (3 months if uncontrolled diabetes)? Yes Next Office Visit with your provider: patient declined   Clinician Recommendations:  Aim for 30 minutes of exercise or brisk walking, 6-8 glasses of water, and 5 servings of fruits and vegetables each day.       This is a list of the screening recommended for you and due dates:  Health Maintenance  Topic Date Due   Pneumococcal Vaccine for age over 76 (1 of 1 - PCV) Never done   Zoster (Shingles) Vaccine (1 of 2) Never done   COVID-19 Vaccine (1 - 2024-25 season) Never done   Flu Shot  01/16/2024   Medicare Annual Wellness Visit  12/02/2024   DTaP/Tdap/Td vaccine (2 - Tdap) 02/08/2027   Colon Cancer Screening  09/23/2032   Hepatitis C Screening  Completed   HPV Vaccine  Aged Out   Meningitis B Vaccine  Aged Out    Advanced directives: (Declined) Advance directive discussed with you today. Even though you declined this today, please call our office should you change your mind, and we can give you the proper paperwork for you to fill out. Advance Care Planning is important because it:  [x]  Makes sure you receive the medical care that is consistent with your values, goals, and preferences  [x]  It provides guidance to your family and loved ones and reduces their decisional burden about  whether or not they are making the right decisions based on your wishes.  Follow the link provided in your after visit summary or read over the paperwork we have mailed to you to help you started getting your Advance Directives in place. If you need assistance in completing these, please reach out to us  so that we can help you!  See attachments for Preventive Care and Fall Prevention Tips.

## 2023-12-15 ENCOUNTER — Telehealth: Payer: Self-pay | Admitting: *Deleted

## 2023-12-15 NOTE — Telephone Encounter (Signed)
 Pt was called - no answer; left message for pt to call the office to schedule an appt.

## 2023-12-15 NOTE — Telephone Encounter (Signed)
 Copied from CRM [id number]. Topic: Clinical - Medical Advice >> Dec 15, 2023  4:08 PM Alfonso ORN wrote: Reason for CRM: need advice about the weight loss , had interested in the Tirzepatide for weight loss, patient want to lose weight loss   patient call back number [phone] or can seen a mychart message ,perfer to talk to someone

## 2023-12-16 ENCOUNTER — Ambulatory Visit: Payer: Self-pay | Admitting: Dietician

## 2023-12-16 NOTE — Telephone Encounter (Signed)
 Pt has an appt 7/7/ with Dr Toma.

## 2023-12-22 ENCOUNTER — Ambulatory Visit: Admitting: Student

## 2024-02-17 ENCOUNTER — Other Ambulatory Visit: Payer: Self-pay

## 2024-02-17 ENCOUNTER — Other Ambulatory Visit (HOSPITAL_COMMUNITY): Payer: Self-pay

## 2024-05-14 ENCOUNTER — Other Ambulatory Visit: Payer: Self-pay | Admitting: Internal Medicine

## 2024-05-14 ENCOUNTER — Other Ambulatory Visit (HOSPITAL_COMMUNITY): Payer: Self-pay

## 2024-05-14 ENCOUNTER — Other Ambulatory Visit: Payer: Self-pay

## 2024-05-14 DIAGNOSIS — I10 Essential (primary) hypertension: Secondary | ICD-10-CM

## 2024-05-17 ENCOUNTER — Telehealth: Payer: Self-pay | Admitting: Student

## 2024-05-17 DIAGNOSIS — I10 Essential (primary) hypertension: Secondary | ICD-10-CM

## 2024-05-17 NOTE — Telephone Encounter (Unsigned)
 Copied from CRM #[id number]. Topic: Clinical - Medication Refill >> May 17, 2024 10:30 AM Miquel SAILOR wrote: Medication: losartan  (COZAAR ) 50 MG tablet   Has the patient contacted their pharmacy? Yes (Agent: If no, request that the patient contact the pharmacy for the refill. If patient does not wish to contact the pharmacy document the reason why and proceed with request.) (Agent: If yes, when and what did the pharmacy advise?)  This is the patient's preferred pharmacy:    [facility] [address], [address] [place] [address] Phone: [phone] Fax: [phone]  Is this the correct pharmacy for this prescription? Yes If no, delete pharmacy and type the correct one.   Has the prescription been filled recently? Yes  Is the patient out of the medication? No  Has the patient been seen for an appointment in the last year OR does the patient have an upcoming appointment? Yes  Can we respond through MyChart? Yes  Agent: Please be advised that Rx refills may take up to 3 business days. We ask that you follow-up with your pharmacy.

## 2024-05-17 NOTE — Telephone Encounter (Signed)
 Patient was last seen 06/27/23 I called the patient to schedule a appointment. I was unable to reach the patient, I lvm for him to give us  a call back.

## 2024-05-18 ENCOUNTER — Encounter: Payer: Self-pay | Admitting: Student

## 2024-05-18 ENCOUNTER — Other Ambulatory Visit (HOSPITAL_COMMUNITY): Payer: Self-pay

## 2024-05-18 ENCOUNTER — Other Ambulatory Visit: Payer: Self-pay

## 2024-05-18 ENCOUNTER — Telehealth (HOSPITAL_COMMUNITY): Payer: Self-pay

## 2024-05-18 ENCOUNTER — Encounter (HOSPITAL_COMMUNITY): Payer: Self-pay

## 2024-05-18 ENCOUNTER — Ambulatory Visit: Admitting: Student

## 2024-05-18 VITALS — BP 157/75 | HR 67 | Temp 98.0°F | Ht 70.0 in | Wt 284.6 lb

## 2024-05-18 DIAGNOSIS — Z6841 Body Mass Index (BMI) 40.0 and over, adult: Secondary | ICD-10-CM

## 2024-05-18 DIAGNOSIS — E785 Hyperlipidemia, unspecified: Secondary | ICD-10-CM

## 2024-05-18 DIAGNOSIS — I10 Essential (primary) hypertension: Secondary | ICD-10-CM

## 2024-05-18 DIAGNOSIS — Z87891 Personal history of nicotine dependence: Secondary | ICD-10-CM

## 2024-05-18 DIAGNOSIS — E66813 Obesity, class 3: Secondary | ICD-10-CM | POA: Diagnosis not present

## 2024-05-18 DIAGNOSIS — Z7985 Long-term (current) use of injectable non-insulin antidiabetic drugs: Secondary | ICD-10-CM

## 2024-05-18 MED ORDER — SEMAGLUTIDE-WEIGHT MANAGEMENT 1.7 MG/0.75ML ~~LOC~~ SOAJ
1.7000 mg | SUBCUTANEOUS | 0 refills | Status: DC
  Filled 2024-05-18: qty 3, 28d supply, fill #0

## 2024-05-18 MED ORDER — SEMAGLUTIDE(0.25 OR 0.5MG/DOS) 2 MG/3ML ~~LOC~~ SOPN
0.2500 mg | PEN_INJECTOR | SUBCUTANEOUS | 0 refills | Status: DC
  Filled 2024-05-18 (×2): qty 3, 28d supply, fill #0

## 2024-05-18 MED ORDER — SEMAGLUTIDE-WEIGHT MANAGEMENT 1 MG/0.5ML ~~LOC~~ SOAJ
1.0000 mg | SUBCUTANEOUS | 0 refills | Status: DC
  Filled 2024-05-18: qty 2, 28d supply, fill #0

## 2024-05-18 MED ORDER — SEMAGLUTIDE-WEIGHT MANAGEMENT 2.4 MG/0.75ML ~~LOC~~ SOAJ
2.4000 mg | SUBCUTANEOUS | 0 refills | Status: DC
  Filled 2024-05-18: qty 3, 28d supply, fill #0

## 2024-05-18 MED ORDER — SEMAGLUTIDE (1 MG/DOSE) 4 MG/3ML ~~LOC~~ SOPN
1.0000 mg | PEN_INJECTOR | SUBCUTANEOUS | 0 refills | Status: DC
  Filled 2024-05-18: qty 3, 28d supply, fill #0

## 2024-05-18 MED ORDER — LOSARTAN POTASSIUM 50 MG PO TABS
50.0000 mg | ORAL_TABLET | Freq: Every day | ORAL | 3 refills | Status: DC
  Filled 2024-05-18 (×2): qty 90, 90d supply, fill #0

## 2024-05-18 MED ORDER — ATORVASTATIN CALCIUM 10 MG PO TABS
10.0000 mg | ORAL_TABLET | Freq: Every day | ORAL | 3 refills | Status: AC
  Filled 2024-05-18 (×2): qty 90, 90d supply, fill #0

## 2024-05-18 MED ORDER — OZEMPIC (2 MG/DOSE) 8 MG/3ML ~~LOC~~ SOPN
2.0000 mg | PEN_INJECTOR | SUBCUTANEOUS | 3 refills | Status: DC
  Filled 2024-05-18: qty 3, fill #0

## 2024-05-18 MED ORDER — SEMAGLUTIDE(0.25 OR 0.5MG/DOS) 2 MG/3ML ~~LOC~~ SOPN
0.5000 mg | PEN_INJECTOR | SUBCUTANEOUS | 0 refills | Status: DC
  Filled 2024-05-18: qty 3, 28d supply, fill #0

## 2024-05-18 MED ORDER — SEMAGLUTIDE-WEIGHT MANAGEMENT 0.5 MG/0.5ML ~~LOC~~ SOAJ
0.5000 mg | SUBCUTANEOUS | 0 refills | Status: DC
  Filled 2024-05-18 – 2024-05-20 (×2): qty 2, 28d supply, fill #0

## 2024-05-18 MED ORDER — SEMAGLUTIDE-WEIGHT MANAGEMENT 0.25 MG/0.5ML ~~LOC~~ SOAJ
0.2500 mg | SUBCUTANEOUS | 0 refills | Status: DC
  Filled 2024-05-18 – 2024-05-20 (×4): qty 2, 28d supply, fill #0

## 2024-05-18 MED ORDER — AMLODIPINE-OLMESARTAN 10-40 MG PO TABS
1.0000 | ORAL_TABLET | Freq: Every day | ORAL | 3 refills | Status: AC
  Filled 2024-05-18 (×2): qty 90, 90d supply, fill #0

## 2024-05-18 NOTE — Assessment & Plan Note (Addendum)
 BP Readings from Last 3 Encounters:  05/18/24 (!) 157/75  12/03/23 132/83  06/27/23 (!) 149/73   Chronic, variable control, hypertensive in clinic today. Home readings are discordant, less by ~20 mmHg. Cardiopulmonary exam normal. Discontinued losartan  50 mg and amlodipine  10 mg in favor of amlodipine -olmesartan 10-40 mg combination. Working on weight loss. Return to clinic in 3 months for follow-up. BMP normal.  Orders:   Basic metabolic panel with GFR   amLODipine -olmesartan (AZOR) 10-40 MG tablet; Take 1 tablet by mouth daily.

## 2024-05-18 NOTE — Patient Instructions (Signed)
 VISIT SUMMARY: You came in for a routine follow-up to check on your blood pressure and discuss weight management. We reviewed your home and clinic blood pressure readings, discussed your current medications, and explored options for weight loss.  YOUR PLAN: ESSENTIAL HYPERTENSION: Your blood pressure readings at home are slightly lower than those taken in the clinic, likely due to white coat syndrome. -We have refilled your losartan  for 90 days and synchronized the refill dates. -Please bring your home blood pressure cuff to your next appointment so we can compare it with the clinic readings.  HYPERLIPIDEMIA: Managing your cholesterol is important to reduce your cardiovascular risk. -We will continue to monitor your cholesterol levels and adjust medications as needed.  OBESITY: Losing weight can help control your blood pressure and improve your overall health. -We discussed medication-assisted weight loss options and their side effects. -You have been prescribed Ozempic or Mounjaro, starting at a low dose and increasing as tolerated. -Consider a referral to a dietitian for dietary counseling, though you are currently hesitant.  Remember to bring all of the medications that you take (including over the counter medications and supplements) with you to every clinic visit.  This after visit summary is an important review of tests, referrals, and medication changes that were discussed during your visit. If you have questions or concerns, call [phone]. Outside of clinic business hours, call the main hospital at [phone] and ask the operator for the on-call internal medicine resident.   Ozell Kung MD 05/18/2024, 3:10 PM

## 2024-05-18 NOTE — Assessment & Plan Note (Addendum)
 Lab Results  Component Value Date   LDLCALC 92 05/18/2024   LDLCALC 114 (H) 06/27/2023   LDLCALC 70 12/02/2022   LDLCALC 131 (H) 05/21/2022   The 10-year ASCVD risk score (Arnett DK, et al., 2019) is: 18.2%  Relatively high ASCVD risk. Less than optimal response to moderate intensity statin therapy (>30% reduction from peak of 131). Recommend increasing to high intensity therapy at follow-up.  Orders:   atorvastatin  (LIPITOR) 10 MG tablet; Take 1 tablet (10 mg total) by mouth daily.   Lipid Profile

## 2024-05-18 NOTE — Telephone Encounter (Signed)
 Ozempic/Mounjaro is approved exclusively as an adjunct to diet and exercise to improve glycemic  control in adults with type 2 diabetes mellitus. A review of patient's medical chart reveals no  documented diagnosis of type 2 diabetes or an A1C indicative of diabetes. Therefore, they do not  currently meet the criteria for prior authorization of this medication. If clinically appropriate, alternative  options such as Saxenda, Zepbound, or Georjean may be considered for this patient.   To help increase the likelihood of a successful prior authorization for this GLP1, please add a diagnosis of Type II diabetes to the patient's problem list, if clinically appropriate, and associate the diagnosis with a recent visit note. Thank you for your partnership.

## 2024-05-18 NOTE — Assessment & Plan Note (Deleted)
 BMI Readings from Last 3 Encounters:  05/18/24 40.84 kg/m  12/03/23 38.74 kg/m  06/27/23 37.98 kg/m   I recommend eliminating sugary beverages like soda, sweet tea, and juice entirely. I recommended more vegetables, lean protein, and legumes. Frozen vegetables are healthy and inexpensive. Beans are a healthy and inexpensive source of lean protein and fiber. I recommend gradually increasing exercise. A daily walk is a great way to start an exercise program.  Referral: denies medical nutrition therapy  Pharmacological intervention: starting Ozempic 0.25 weekly, to titrate up as tolerated   Orders:   Semaglutide,0.25 or 0.5MG /DOS, 2 MG/3ML SOPN; Inject 0.25 mg into the skin once a week for 28 days.   Semaglutide,0.25 or 0.5MG /DOS, 2 MG/3ML SOPN; Inject 0.5 mg into the skin once a week for 28 days.   Semaglutide, 1 MG/DOSE, 4 MG/3ML SOPN; Inject 1 mg into the skin once a week for 27 days.   Semaglutide, 2 MG/DOSE, (OZEMPIC, 2 MG/DOSE,) 8 MG/3ML SOPN; Inject 2 mg into the skin once a week.

## 2024-05-18 NOTE — Progress Notes (Unsigned)
 Patient name: Brian Baldwin Date of birth: 11-Apr-1956 Date of visit: 05/19/24  Subjective  Reason for visit: Follow-up (Routine office visit / medication refill)  Discussed the use of AI scribe software for clinical note transcription with the patient, who gave verbal consent to proceed.  History of Present Illness   Brian Baldwin is a 68 year old male with hypertension who presents for a routine follow-up and medication update.  Hypertension - Blood pressure readings at home average 138-140/78-80 mmHg - Recent clinic blood pressure measured at 157/75 mmHg - Attributes higher clinic readings to lack of arm support during measurement - Currently taking losartan  for blood pressure control - Has been on antihypertensive medications for several years - Coordinating medication refills with pharmacy to align refill dates - No history of diabetes, sleep apnea, or pancreatitis  Weight management - Concerned about weight and its impact on blood pressure control - Considering weight loss medications - Maintaining weight has become more challenging with age and retirement - Engages in physical activities including side leg lifts and chest exercises  Dietary habits - Diet consists of toast with honey and avocado, boiled eggs, fresh orange juice, and beet juice - Avoids processed foods - Prepares most meals at home       Outpatient Medications Prior to Visit  Medication Sig   Coenzyme Q10 (COQ-10 PO) Take 1 tablet by mouth daily.   Multiple Vitamin (MULTIVITAMIN WITH MINERALS) TABS tablet Take 1 tablet by mouth daily.   [DISCONTINUED] amLODipine  (NORVASC ) 10 MG tablet Take 1 tablet (10 mg total) by mouth daily.   [DISCONTINUED] atorvastatin  (LIPITOR) 10 MG tablet Take 1 tablet (10 mg total) by mouth daily.   [DISCONTINUED] losartan  (COZAAR ) 50 MG tablet Take 1 tablet (50 mg total) by mouth daily. Please schedule PCP follow-up for additional refills.   No facility-administered medications  prior to visit.     Objective  Today's Vitals   05/18/24 1418 05/18/24 1425  BP: (!) 167/90 (!) 157/75  Pulse: 66 67  Temp: 98 F (36.7 C)   TempSrc: Oral   SpO2: 94%   Weight: 284 lb 9.6 oz (129.1 kg)   Height: 5' 10 (1.778 m)   PainSc: 0-No pain   Body mass index is 40.84 kg/m.   Physical Exam Constitutional:      Appearance: Normal appearance.  Cardiovascular:     Rate and Rhythm: Normal rate and regular rhythm.     Pulses: Normal pulses.     Heart sounds: No murmur heard. Pulmonary:     Effort: Pulmonary effort is normal. No respiratory distress.  Abdominal:     General: There is no distension.     Tenderness: There is no abdominal tenderness.  Skin:    General: Skin is warm and dry.  Neurological:     Mental Status: He is alert.     Cranial Nerves: No facial asymmetry.  Psychiatric:        Mood and Affect: Affect normal.        Speech: Speech normal.        Behavior: Behavior normal.     Assessment & Plan Hypertension, unspecified type BP Readings from Last 3 Encounters:  05/18/24 (!) 157/75  12/03/23 132/83  06/27/23 (!) 149/73   Chronic, variable control, hypertensive in clinic today. Home readings are discordant, less by ~20 mmHg. Cardiopulmonary exam normal. Discontinued losartan  50 mg and amlodipine  10 mg in favor of amlodipine -olmesartan  10-40 mg combination. Working on weight loss. Return to clinic in 3  months for follow-up. BMP normal.  Orders:   Basic metabolic panel with GFR   amLODipine -olmesartan (AZOR) 10-40 MG tablet; Take 1 tablet by mouth daily.  Hyperlipidemia, unspecified hyperlipidemia type Lab Results  Component Value Date   LDLCALC 92 05/18/2024   LDLCALC 114 (H) 06/27/2023   LDLCALC 70 12/02/2022   LDLCALC 131 (H) 05/21/2022   The 10-year ASCVD risk score (Arnett DK, et al., 2019) is: 18.2%  Relatively high ASCVD risk. Less than optimal response to moderate intensity statin therapy (>30% reduction from peak of 131). Recommend  increasing to high intensity therapy at follow-up.  Orders:   atorvastatin  (LIPITOR) 10 MG tablet; Take 1 tablet (10 mg total) by mouth daily.   Lipid Profile  Obesity, Class III, BMI 40-49.9 (morbid obesity) (HCC) BMI Readings from Last 3 Encounters:  05/18/24 40.84 kg/m  12/03/23 38.74 kg/m  06/27/23 37.98 kg/m   I recommend eliminating sugary beverages like soda, sweet tea, and juice entirely. I recommended more vegetables, lean protein, and legumes. Frozen vegetables are healthy and inexpensive. Beans are a healthy and inexpensive source of lean protein and fiber. I recommend gradually increasing exercise. A daily walk is a great way to start an exercise program.  Referral: denies medical nutrition therapy  Pharmacological intervention: GLP-1 agonists not covered by his insurance.   Orders:   semaglutide-weight management (WEGOVY) 0.25 MG/0.5ML SOAJ SQ injection; Inject 0.25 mg into the skin once a week for 28 days.   semaglutide-weight management (WEGOVY) 0.5 MG/0.5ML SOAJ SQ injection; Inject 0.5 mg into the skin once a week for 28 days.   semaglutide-weight management (WEGOVY) 1 MG/0.5ML SOAJ SQ injection; Inject 1 mg into the skin once a week for 28 days.   semaglutide-weight management (WEGOVY) 1.7 MG/0.75ML SOAJ SQ injection; Inject 1.7 mg into the skin once a week for 28 days.   semaglutide-weight management (WEGOVY) 2.4 MG/0.75ML SOAJ SQ injection; Inject 2.4 mg into the skin once a week for 28 days.  Return in about 3 months (around 08/16/2024).  Ozell Kung MD 05/19/2024, 4:29 PM

## 2024-05-18 NOTE — Assessment & Plan Note (Signed)
 BMI Readings from Last 3 Encounters:  05/18/24 40.84 kg/m  12/03/23 38.74 kg/m  06/27/23 37.98 kg/m   I recommend eliminating sugary beverages like soda, sweet tea, and juice entirely. I recommended more vegetables, lean protein, and legumes. Frozen vegetables are healthy and inexpensive. Beans are a healthy and inexpensive source of lean protein and fiber. I recommend gradually increasing exercise. A daily walk is a great way to start an exercise program.  Referral: denies medical nutrition therapy  Pharmacological intervention: starting Wegovy 0.25 weekly, to titrate up as tolerated   Orders:   semaglutide-weight management (WEGOVY) 0.25 MG/0.5ML SOAJ SQ injection; Inject 0.25 mg into the skin once a week for 28 days.   semaglutide-weight management (WEGOVY) 0.5 MG/0.5ML SOAJ SQ injection; Inject 0.5 mg into the skin once a week for 28 days.   semaglutide-weight management (WEGOVY) 1 MG/0.5ML SOAJ SQ injection; Inject 1 mg into the skin once a week for 28 days.   semaglutide-weight management (WEGOVY) 1.7 MG/0.75ML SOAJ SQ injection; Inject 1.7 mg into the skin once a week for 28 days.   semaglutide-weight management (WEGOVY) 2.4 MG/0.75ML SOAJ SQ injection; Inject 2.4 mg into the skin once a week for 28 days.

## 2024-05-18 NOTE — Telephone Encounter (Signed)
 PA request has been Received. New Encounter has been or will be created for follow up. For additional info see Pharmacy Prior Auth telephone encounter from 05/18/24.

## 2024-05-19 ENCOUNTER — Other Ambulatory Visit (HOSPITAL_COMMUNITY): Payer: Self-pay

## 2024-05-19 ENCOUNTER — Ambulatory Visit: Payer: Self-pay | Admitting: Student

## 2024-05-19 ENCOUNTER — Telehealth (HOSPITAL_COMMUNITY): Payer: Self-pay

## 2024-05-19 ENCOUNTER — Other Ambulatory Visit: Payer: Self-pay

## 2024-05-19 LAB — LIPID PANEL
Chol/HDL Ratio: 2.8 ratio (ref 0.0–5.0)
Cholesterol, Total: 180 mg/dL (ref 100–199)
HDL: 64 mg/dL (ref >39)
LDL Chol Calc (NIH): 92 mg/dL (ref 0–99)
Triglycerides: 139 mg/dL (ref 0–149)
VLDL Cholesterol Cal: 24 mg/dL (ref 5–40)

## 2024-05-19 LAB — BASIC METABOLIC PANEL WITH GFR
BUN/Creatinine Ratio: 14 (ref 10–24)
BUN: 14 mg/dL (ref 8–27)
CO2: 19 mmol/L — ABNORMAL LOW (ref 20–29)
Calcium: 9.5 mg/dL (ref 8.6–10.2)
Chloride: 104 mmol/L (ref 96–106)
Creatinine, Ser: 1.02 mg/dL (ref 0.76–1.27)
Glucose: 90 mg/dL (ref 70–99)
Potassium: 4.1 mmol/L (ref 3.5–5.2)
Sodium: 140 mmol/L (ref 134–144)
eGFR: 81 mL/min/1.73 (ref >59)

## 2024-05-19 NOTE — Telephone Encounter (Signed)
 PA request has been Received. New Encounter has been or will be created for follow up. For additional info see Pharmacy Prior Auth telephone encounter from 05/19/24.

## 2024-05-19 NOTE — Telephone Encounter (Signed)
 Pharmacy Patient Advocate Encounter   Received notification from Pt Calls Messages that prior authorization for Wegovy 0.25 mg/0.5 ml auto injectors is required/requested.   Insurance verification completed.   The patient is insured through [employer].   Per test claim: Per test claim, medication is not covered due to plan/benefit exclusion, PA not submitted at this time    *I did test claims for Contrave, Qsymia and Phentermine but none of these are covered. The patient might need to call the insurance and find out if they will cover ant weight loss drugs.

## 2024-05-20 ENCOUNTER — Telehealth: Payer: Self-pay | Admitting: *Deleted

## 2024-05-20 ENCOUNTER — Other Ambulatory Visit: Payer: Self-pay

## 2024-05-20 ENCOUNTER — Encounter: Payer: Self-pay | Admitting: Student

## 2024-05-20 ENCOUNTER — Other Ambulatory Visit (HOSPITAL_COMMUNITY): Payer: Self-pay

## 2024-05-20 ENCOUNTER — Telehealth: Payer: Self-pay | Admitting: Student

## 2024-05-20 DIAGNOSIS — E66813 Obesity, class 3: Secondary | ICD-10-CM

## 2024-05-20 NOTE — Telephone Encounter (Signed)
 Will forward to Dr. Norrine.           Copied from CRM [id number]. Topic: Clinical - Prescription Issue >> May 20, 2024  2:44 PM Diannia H wrote: Reason for CRM: Patient is calling because he is wanting to let the provider know that authorization has been done and his new insurance company is needing to know which medicine he is going to provide either the Ozempic or Mounjaro. Could you please assist and have the provider call the patient or insurance for the authorization code. They will not cover Wegovy. >> May 20, 2024  3:27 PM Chiquita SQUIBB wrote: Patient is calling in to let the doctor know that his new insurance company starts on January 1st with [employer]. They stated that they will cover the Ozempic or Mounjaro but not the [facility]. The insurance company needs to know which one of the Ozempic or Mounjaro the doctor would like the patient so they can start on that when the plan starts on January 1st so they can send the doctor an authorization code. Please advise the patient.

## 2024-05-20 NOTE — Telephone Encounter (Signed)
 Message sent to the Front office in regards to Medication listed below:  CRM # [id number] Owner: None Status: Resolved Open  Priority: Routine Created on: 05/19/2024 04:58 PM By: Gerlean Kip HERO   Primary Information  Source  [name], [name] (Patient)   Subject  [name], [name] (Patient)   Topic  Clinical - Prescription Issue    Communication  Reason for CRM: patient calling to leave a message for Dr Norrine about his new prescription written for [facility]    Patient states his insurance will cover other prescriptions but not Wegovy    Ozempic and Mounjaro are covered Most everything but Georjean is covered        Pt num [phone] BENNIE)        [facility]    [address], [address] [place] [address]    Phone: [phone] Fax: [phone]

## 2024-05-21 ENCOUNTER — Other Ambulatory Visit (HOSPITAL_COMMUNITY): Payer: Self-pay

## 2024-05-21 ENCOUNTER — Other Ambulatory Visit: Payer: Self-pay

## 2024-05-21 MED ORDER — SEMAGLUTIDE-WEIGHT MANAGEMENT 0.5 MG/0.5ML ~~LOC~~ SOAJ: 0.5000 mg | SUBCUTANEOUS | 0 refills | Status: DC

## 2024-05-21 MED ORDER — SEMAGLUTIDE-WEIGHT MANAGEMENT 0.25 MG/0.5ML ~~LOC~~ SOAJ: 0.2500 mg | SUBCUTANEOUS | 0 refills | Status: DC

## 2024-05-21 MED ORDER — SEMAGLUTIDE-WEIGHT MANAGEMENT 0.25 MG/0.5ML ~~LOC~~ SOAJ
0.2500 mg | SUBCUTANEOUS | 0 refills | Status: AC
  Filled 2024-05-21 – 2024-05-24 (×2): qty 2, 28d supply, fill #0

## 2024-05-21 MED ORDER — SEMAGLUTIDE-WEIGHT MANAGEMENT 0.5 MG/0.5ML ~~LOC~~ SOAJ
0.5000 mg | SUBCUTANEOUS | 0 refills | Status: AC
  Filled 2024-05-21 – 2024-06-21 (×5): qty 2, 28d supply, fill #0

## 2024-05-21 NOTE — Telephone Encounter (Signed)
 Sent Wegovy  to [facility] for self-pay patient. Per Wegovy  website: $199 for first 2 months of 0.25 mg and 0.5 mg for new patients, then $349/month for all doses afterwards.  Meds ordered this encounter  Medications   semaglutide -weight management (WEGOVY ) 0.5 MG/0.5ML SOAJ SQ injection    Sig: Inject 0.5 mg into the skin once a week for 28 days.    Dispense:  2 mL    Refill:  0   semaglutide -weight management (WEGOVY ) 0.25 MG/0.5ML SOAJ SQ injection    Sig: Inject 0.25 mg into the skin once a week for 28 days.    Dispense:  2 mL    Refill:  0   Ozell Kung MD 05/21/2024, 11:20 AM

## 2024-05-21 NOTE — Telephone Encounter (Signed)
 RTC to patient stated that he has talked with the [employer] and they told him that the Ozempic  or the Mounjaro would not be covered. Patient plans to pay for the medication himself.

## 2024-05-24 ENCOUNTER — Other Ambulatory Visit (HOSPITAL_COMMUNITY): Payer: Self-pay

## 2024-05-24 ENCOUNTER — Other Ambulatory Visit: Payer: Self-pay

## 2024-05-24 NOTE — Progress Notes (Signed)
 Internal Medicine Clinic Attending  Case discussed with the resident at the time of the visit.  We reviewed the resident's history and exam and pertinent patient test results.  I agree with the assessment, diagnosis, and plan of care documented in the resident's note.

## 2024-05-24 NOTE — Telephone Encounter (Unsigned)
 Copied from CRM [id number]. Topic: Clinical - Prescription Issue >> May 20, 2024  2:44 PM Diannia H wrote: Reason for CRM: Patient is calling because he is wanting to let the provider know that authorization has been done and his new insurance company is needing to know which medicine he is going to provide either the Ozempic  or Mounjaro. Could you please assist and have the provider call the patient or insurance for the authorization code. They will not cover Wegovy . >> May 21, 2024 12:24 PM Graeme ORN wrote: [facility] pharmacy called. States there was confusion with prescription. Should still be sent to [facility] outpatient pharmacy and not [facility]. They have it set up to go out Monday for patient to get Tuesday for WeGovy  but the rx was cancelled. Let her know clinic closed at 12 today. Thank You  >> May 20, 2024  3:27 PM Chiquita SQUIBB wrote: Patient is calling in to let the doctor know that his new insurance company starts on January 1st with [employer]. They stated that they will cover the Ozempic  or Mounjaro but not the Wegovy . The insurance company needs to know which one of the Ozempic  or Mounjaro the doctor would like the patient so they can start on that when the plan starts on January 1st so they can send the doctor an authorization code. Please advise the patient.

## 2024-05-25 ENCOUNTER — Other Ambulatory Visit (HOSPITAL_BASED_OUTPATIENT_CLINIC_OR_DEPARTMENT_OTHER): Payer: Self-pay

## 2024-06-04 ENCOUNTER — Other Ambulatory Visit (HOSPITAL_COMMUNITY): Payer: Self-pay

## 2024-06-15 ENCOUNTER — Other Ambulatory Visit (HOSPITAL_COMMUNITY): Payer: Self-pay

## 2024-06-15 ENCOUNTER — Other Ambulatory Visit: Payer: Self-pay

## 2024-06-16 ENCOUNTER — Other Ambulatory Visit (HOSPITAL_COMMUNITY): Payer: Self-pay

## 2024-06-16 ENCOUNTER — Encounter: Payer: Self-pay | Admitting: Student

## 2024-06-18 ENCOUNTER — Other Ambulatory Visit (HOSPITAL_COMMUNITY): Payer: Self-pay

## 2024-06-21 ENCOUNTER — Other Ambulatory Visit: Payer: Self-pay

## 2024-06-21 ENCOUNTER — Other Ambulatory Visit (HOSPITAL_COMMUNITY): Payer: Self-pay

## 2024-06-24 NOTE — Telephone Encounter (Signed)
 Pt had sent a message as noted on 12/30 - I called pt to f/u on his symptoms; he stated since it took so long to reply; he stated I could had died.I talked to the pt who stated the dizziness and CP were resolved when he stopped taking Wegovy ; stated he did research and found out not to take wegovy  with Losartan  - Stated the doctor should had known this. When I explained to him Losartan  had been discontinued and combo pill was ordered (per current med list); he stated we should know what's he's on.Stated he does not need an appt at this time. He also stated he gave the front office his new insurance information [employer]) which  started the beginning of this year ([employer] is still on his chart) - stated per [facility], someone is double dipping. When I asked pt to explain - he became frustrated,stated he no longer wants to talk to me and hung up the phone.

## 2024-06-24 NOTE — Telephone Encounter (Signed)
 Please review message

## 2024-06-24 NOTE — Telephone Encounter (Signed)
 I called pt to review his symptoms so we can schedule an appt - no answer; left message on his vm to call the office.

## 2024-08-17 ENCOUNTER — Ambulatory Visit: Admitting: Student
